# Patient Record
Sex: Female | Born: 1975
Health system: Southern US, Community
[De-identification: ages and names within clinical notes are randomized; demographics above are authoritative.]

## PROBLEM LIST (undated history)

## (undated) DIAGNOSIS — I10 Essential (primary) hypertension: Secondary | ICD-10-CM

## (undated) DIAGNOSIS — K469 Unspecified abdominal hernia without obstruction or gangrene: Secondary | ICD-10-CM

## (undated) HISTORY — DX: Unspecified abdominal hernia without obstruction or gangrene: K46.9

## (undated) HISTORY — DX: Essential (primary) hypertension: I10

## (undated) HISTORY — PX: HERNIA REPAIR: SHX51

---

## 2004-06-01 ENCOUNTER — Ambulatory Visit (HOSPITAL_COMMUNITY): Admission: AD | Admit: 2004-06-01 | Discharge: 2004-06-01 | Payer: Self-pay | Admitting: Obstetrics and Gynecology

## 2004-08-02 ENCOUNTER — Inpatient Hospital Stay (HOSPITAL_COMMUNITY): Admission: AD | Admit: 2004-08-02 | Discharge: 2004-08-05 | Payer: Self-pay | Admitting: Obstetrics and Gynecology

## 2006-02-08 ENCOUNTER — Emergency Department (HOSPITAL_COMMUNITY): Admission: EM | Admit: 2006-02-08 | Discharge: 2006-02-08 | Payer: Self-pay | Admitting: Emergency Medicine

## 2006-07-19 ENCOUNTER — Emergency Department (HOSPITAL_COMMUNITY): Admission: EM | Admit: 2006-07-19 | Discharge: 2006-07-19 | Payer: Self-pay | Admitting: Emergency Medicine

## 2007-08-08 ENCOUNTER — Emergency Department (HOSPITAL_COMMUNITY): Admission: EM | Admit: 2007-08-08 | Discharge: 2007-08-08 | Payer: Self-pay | Admitting: Emergency Medicine

## 2008-03-31 ENCOUNTER — Other Ambulatory Visit: Admission: RE | Admit: 2008-03-31 | Discharge: 2008-03-31 | Payer: Self-pay | Admitting: Obstetrics and Gynecology

## 2008-06-10 ENCOUNTER — Encounter (HOSPITAL_COMMUNITY): Admission: RE | Admit: 2008-06-10 | Discharge: 2008-07-10 | Payer: Self-pay | Admitting: Obstetrics and Gynecology

## 2008-09-03 ENCOUNTER — Inpatient Hospital Stay (HOSPITAL_COMMUNITY): Admission: AD | Admit: 2008-09-03 | Discharge: 2008-09-03 | Payer: Self-pay | Admitting: Obstetrics and Gynecology

## 2008-09-16 ENCOUNTER — Inpatient Hospital Stay (HOSPITAL_COMMUNITY): Admission: AD | Admit: 2008-09-16 | Discharge: 2008-09-19 | Payer: Self-pay | Admitting: Obstetrics and Gynecology

## 2008-09-17 ENCOUNTER — Encounter (INDEPENDENT_AMBULATORY_CARE_PROVIDER_SITE_OTHER): Payer: Self-pay | Admitting: Obstetrics and Gynecology

## 2010-01-03 ENCOUNTER — Emergency Department (HOSPITAL_COMMUNITY): Admission: EM | Admit: 2010-01-03 | Discharge: 2010-01-03 | Payer: Self-pay | Admitting: Emergency Medicine

## 2010-01-04 ENCOUNTER — Ambulatory Visit (HOSPITAL_COMMUNITY): Admission: RE | Admit: 2010-01-04 | Discharge: 2010-01-04 | Payer: Self-pay | Admitting: Emergency Medicine

## 2010-01-04 ENCOUNTER — Inpatient Hospital Stay (HOSPITAL_COMMUNITY): Admission: EM | Admit: 2010-01-04 | Discharge: 2010-01-10 | Payer: Self-pay | Admitting: Emergency Medicine

## 2010-01-04 ENCOUNTER — Ambulatory Visit: Payer: Self-pay | Admitting: Gastroenterology

## 2010-01-05 ENCOUNTER — Encounter: Payer: Self-pay | Admitting: Gastroenterology

## 2010-01-05 ENCOUNTER — Ambulatory Visit: Payer: Self-pay | Admitting: Internal Medicine

## 2010-01-06 ENCOUNTER — Encounter (INDEPENDENT_AMBULATORY_CARE_PROVIDER_SITE_OTHER): Payer: Self-pay | Admitting: General Surgery

## 2010-01-07 ENCOUNTER — Ambulatory Visit: Payer: Self-pay | Admitting: Internal Medicine

## 2010-01-09 ENCOUNTER — Ambulatory Visit: Payer: Self-pay | Admitting: Internal Medicine

## 2010-01-10 ENCOUNTER — Ambulatory Visit: Payer: Self-pay | Admitting: Internal Medicine

## 2010-01-11 ENCOUNTER — Encounter (INDEPENDENT_AMBULATORY_CARE_PROVIDER_SITE_OTHER): Payer: Self-pay

## 2010-01-11 ENCOUNTER — Encounter: Payer: Self-pay | Admitting: Internal Medicine

## 2010-01-11 DIAGNOSIS — R799 Abnormal finding of blood chemistry, unspecified: Secondary | ICD-10-CM | POA: Insufficient documentation

## 2010-01-12 ENCOUNTER — Encounter: Payer: Self-pay | Admitting: Internal Medicine

## 2010-01-25 ENCOUNTER — Encounter: Payer: Self-pay | Admitting: Internal Medicine

## 2010-01-31 LAB — CONVERTED CEMR LAB
ALT: 10 units/L (ref 0–35)
Albumin: 4.4 g/dL (ref 3.5–5.2)
Alkaline Phosphatase: 75 units/L (ref 39–117)
Bilirubin, Direct: 0.3 mg/dL (ref 0.0–0.3)

## 2010-11-02 ENCOUNTER — Ambulatory Visit: Payer: Self-pay | Admitting: Otolaryngology

## 2010-12-13 NOTE — Consult Note (Signed)
Summary: Consultation Report  Consultation Report   Imported By: Ricard Dillon 01/05/2010 09:01:08  _____________________________________________________________________  External Attachment:    Type:   Image     Comment:   External Document

## 2010-12-13 NOTE — Miscellaneous (Signed)
Summary: Orders Update  Clinical Lists Changes  Problems: Added new problem of BLOOD CHEMISTRY, ABNORMAL (ICD-790.99) Orders: Added new Test order of T-Hepatic Function ([id number]) - Signed 

## 2010-12-13 NOTE — Letter (Signed)
Summary: Recall, Labs Needed  [facility]  [address]   [place], [place] [address]   Phone: [phone]  Fax: [phone]    January 11, 2010  Ashley [name] [address] [place], [place]  [address] 02-Dec-1975   Dear [name],   Our records indicate it is time to repeat your blood work.  You can take the enclosed form to the lab on or near the date indicated.  Please make note of the new location of the lab:   [address], 2nd floor   [facility]  Our office will call you within a week to ten business days with the results.  If you do not hear from [place] in 10 business days, you should call the office.  If you have any questions regarding this, call the office at [phone], and ask for the nurse.  Labs are due on 01/25/2010.   Sincerely,    Hendricks Limes LPN  [facility] Ph: [phone]   Fax: [phone]

## 2011-01-31 LAB — PROTIME-INR
INR: 1.13 (ref 0.00–1.49)
Prothrombin Time: 14.4 seconds (ref 11.6–15.2)

## 2011-01-31 LAB — DIFFERENTIAL
Basophils Absolute: 0 10*3/uL (ref 0.0–0.1)
Basophils Absolute: 0 10*3/uL (ref 0.0–0.1)
Basophils Relative: 0 % (ref 0–1)
Basophils Relative: 0 % (ref 0–1)
Eosinophils Absolute: 0 10*3/uL (ref 0.0–0.7)
Eosinophils Absolute: 0 10*3/uL (ref 0.0–0.7)
Eosinophils Relative: 0 % (ref 0–5)
Lymphocytes Relative: 19 % (ref 12–46)
Lymphocytes Relative: 4 % — ABNORMAL LOW (ref 12–46)
Lymphs Abs: 1.3 10*3/uL (ref 0.7–4.0)
Monocytes Absolute: 0.5 10*3/uL (ref 0.1–1.0)
Monocytes Relative: 10 % (ref 3–12)
Monocytes Relative: 4 % (ref 3–12)
Neutro Abs: 10.3 10*3/uL — ABNORMAL HIGH (ref 1.7–7.7)
Neutro Abs: 3.3 10*3/uL (ref 1.7–7.7)
Neutro Abs: 3.8 10*3/uL (ref 1.7–7.7)
Neutro Abs: 3.8 10*3/uL (ref 1.7–7.7)
Neutrophils Relative %: 63 % (ref 43–77)
Neutrophils Relative %: 70 % (ref 43–77)
Neutrophils Relative %: 72 % (ref 43–77)

## 2011-01-31 LAB — COMPREHENSIVE METABOLIC PANEL
ALT: 113 U/L — ABNORMAL HIGH (ref 0–35)
ALT: 162 U/L — ABNORMAL HIGH (ref 0–35)
ALT: 40 U/L — ABNORMAL HIGH (ref 0–35)
AST: 109 U/L — ABNORMAL HIGH (ref 0–37)
AST: 116 U/L — ABNORMAL HIGH (ref 0–37)
AST: 155 U/L — ABNORMAL HIGH (ref 0–37)
AST: 234 U/L — ABNORMAL HIGH (ref 0–37)
Albumin: 4.2 g/dL (ref 3.5–5.2)
BUN: 7 mg/dL (ref 6–23)
Calcium: 8.7 mg/dL (ref 8.4–10.5)
Calcium: 9.3 mg/dL (ref 8.4–10.5)
Creatinine, Ser: 0.91 mg/dL (ref 0.4–1.2)
Creatinine, Ser: 0.96 mg/dL (ref 0.4–1.2)
Creatinine, Ser: 0.97 mg/dL (ref 0.4–1.2)
GFR calc Af Amer: >60 mL/min (ref >60)
GFR calc Af Amer: >60 mL/min (ref >60)
GFR calc Af Amer: >60 mL/min (ref >60)
GFR calc non Af Amer: >60 mL/min (ref >60)
GFR calc non Af Amer: >60 mL/min (ref >60)
Glucose, Bld: 104 mg/dL — ABNORMAL HIGH (ref 70–99)
Glucose, Bld: 89 mg/dL (ref 70–99)
Glucose, Bld: 97 mg/dL (ref 70–99)
Potassium: 4 mEq/L (ref 3.5–5.1)
Potassium: 4.1 mEq/L (ref 3.5–5.1)
Sodium: 133 mEq/L — ABNORMAL LOW (ref 135–145)
Sodium: 139 mEq/L (ref 135–145)
Total Protein: 7.1 g/dL (ref 6.0–8.3)
Total Protein: 7.2 g/dL (ref 6.0–8.3)
Total Protein: 7.7 g/dL (ref 6.0–8.3)

## 2011-01-31 LAB — LIPASE, BLOOD
Lipase: 19 U/L (ref 11–59)
Lipase: 32 U/L (ref 11–59)
Lipase: 50 U/L (ref 11–59)

## 2011-01-31 LAB — HEPATIC FUNCTION PANEL
ALT: 121 U/L — ABNORMAL HIGH (ref 0–35)
AST: 111 U/L — ABNORMAL HIGH (ref 0–37)
AST: 127 U/L — ABNORMAL HIGH (ref 0–37)
AST: 53 U/L — ABNORMAL HIGH (ref 0–37)
Albumin: 3.1 g/dL — ABNORMAL LOW (ref 3.5–5.2)
Albumin: 3.3 g/dL — ABNORMAL LOW (ref 3.5–5.2)
Alkaline Phosphatase: 114 U/L (ref 39–117)
Bilirubin, Direct: 0.4 mg/dL — ABNORMAL HIGH (ref 0.0–0.3)
Bilirubin, Direct: 2.7 mg/dL — ABNORMAL HIGH (ref 0.0–0.3)
Indirect Bilirubin: 1 mg/dL — ABNORMAL HIGH (ref 0.3–0.9)
Total Bilirubin: 1 mg/dL (ref 0.3–1.2)
Total Bilirubin: 1.4 mg/dL — ABNORMAL HIGH (ref 0.3–1.2)
Total Bilirubin: 1.5 mg/dL — ABNORMAL HIGH (ref 0.3–1.2)
Total Bilirubin: 4.8 mg/dL — ABNORMAL HIGH (ref 0.3–1.2)
Total Protein: 6.5 g/dL (ref 6.0–8.3)

## 2011-01-31 LAB — CBC
HCT: 32 % — ABNORMAL LOW (ref 36.0–46.0)
HCT: 33.2 % — ABNORMAL LOW (ref 36.0–46.0)
HCT: 33.8 % — ABNORMAL LOW (ref 36.0–46.0)
Hemoglobin: 10.9 g/dL — ABNORMAL LOW (ref 12.0–15.0)
Hemoglobin: 11.2 g/dL — ABNORMAL LOW (ref 12.0–15.0)
Hemoglobin: 11.5 g/dL — ABNORMAL LOW (ref 12.0–15.0)
Hemoglobin: 13.1 g/dL (ref 12.0–15.0)
MCHC: 33.7 g/dL (ref 30.0–36.0)
MCHC: 33.9 g/dL (ref 30.0–36.0)
MCHC: 34.5 g/dL (ref 30.0–36.0)
MCHC: 34.6 g/dL (ref 30.0–36.0)
MCV: 89.3 fL (ref 78.0–100.0)
MCV: 89.4 fL (ref 78.0–100.0)
Platelets: 227 10*3/uL (ref 150–400)
Platelets: 230 10*3/uL (ref 150–400)
RBC: 3.58 MIL/uL — ABNORMAL LOW (ref 3.87–5.11)
RBC: 3.68 MIL/uL — ABNORMAL LOW (ref 3.87–5.11)
RBC: 3.79 MIL/uL — ABNORMAL LOW (ref 3.87–5.11)
RBC: 4.24 MIL/uL (ref 3.87–5.11)
RDW: 12.9 % (ref 11.5–15.5)
RDW: 13.3 % (ref 11.5–15.5)
RDW: 13.3 % (ref 11.5–15.5)
WBC: 5.1 10*3/uL (ref 4.0–10.5)
WBC: 5.3 10*3/uL (ref 4.0–10.5)

## 2011-01-31 LAB — URINALYSIS, ROUTINE W REFLEX MICROSCOPIC
Bilirubin Urine: NEGATIVE
Ketones, ur: NEGATIVE mg/dL
Leukocytes, UA: NEGATIVE
Nitrite: NEGATIVE
Specific Gravity, Urine: 1.02 (ref 1.005–1.030)
pH: 6 (ref 5.0–8.0)

## 2011-01-31 LAB — BASIC METABOLIC PANEL
CO2: 24 mEq/L (ref 19–32)
Calcium: 9 mg/dL (ref 8.4–10.5)
GFR calc Af Amer: >60 mL/min (ref >60)
GFR calc non Af Amer: >60 mL/min (ref >60)
Sodium: 124 mEq/L — ABNORMAL LOW (ref 135–145)

## 2011-01-31 LAB — PREGNANCY, URINE: Preg Test, Ur: NEGATIVE

## 2011-02-04 LAB — HEPATIC FUNCTION PANEL
ALT: 83 U/L — ABNORMAL HIGH (ref 0–35)
AST: 84 U/L — ABNORMAL HIGH (ref 0–37)
Albumin: 3 g/dL — ABNORMAL LOW (ref 3.5–5.2)
Alkaline Phosphatase: 140 U/L — ABNORMAL HIGH (ref 39–117)
Indirect Bilirubin: 0.8 mg/dL (ref 0.3–0.9)
Total Protein: 6.3 g/dL (ref 6.0–8.3)

## 2011-03-27 NOTE — Discharge Summary (Signed)
NAMEKRISTIAN, HAZZARD            ACCOUNT NO.:  [id number]   MEDICAL RECORD NO.:  [id number]          PATIENT TYPE:  INP   LOCATION:  9123                          [facility]ACILITY:  [facility]   PHYSICIAN:  Hal Morales, M.D.DATE OF BIRTH:  25-Mar-1976   DATE OF ADMISSION:  09/16/2008  DATE OF DISCHARGE:                               DISCHARGE SUMMARY   ADMITTING DIAGNOSES:  1. Intrauterine pregnancy at 39-3/7 weeks.  2. Spontaneous rupture of membranes.  3. Desires tubal sterilization.  4. Negative group B streptococcus.   DISCHARGE DIAGNOSES:  1. Intrauterine pregnancy at 39-3/7 weeks.  2. Spontaneous rupture of membranes.  3. Desires tubal sterilization.  4. Negative group B streptococcus.   PROCEDURES:  1. Normal spontaneous vaginal birth.  2. Repair of second-degree midline laceration.  3. Postpartum tubal sterilization.   HOSPITAL COURSE:  Ashley [name] is a 35 year old gravida 3, para 2-2-0-2  at 39-3/7 weeks, who presented on the evening of September 16, 2008, with  rupture of membranes at approximately 6:45 p.m.  She desired tubal.  Her  pregnancy had been remarkable for:  1. Intrauterine pregnancy at 39-3/7 weeks.  2. First trimester bleeding.  3. Second trimester anemia.  4. Mild periumbilical hernia.   The patient, when she was admitted, she was 4-5, 90%, vertex, -2,  positive Nitrazine was noted, and fluid was clear.  The heart rate was  reactive.  Uterine contractions were every 3.5-7 minutes.  The patient  did report her desire for tubal.  The patient progressed well and  delivered at 1:45 a.m. on September 17, 2008.  She had a girl, weight 6  pounds 10 ounces, Apgars were 8 and 9.  There was a second-degree  laceration noted.  This was repaired in the usual fashion.  The patient  did wish to proceed with tubal sterilization.  This was scheduled at the  first available time at 3 p.m. on the afternoon of September 17, 2008.  At  that time, the patient was taken to the  operating room where Dr. Dierdre Forth performed a bilateral tubal ligation on the patient under spinal  anesthesia.  The patient tolerated the procedure well, and was taken to  recovery in good condition.  Infant remained in the full-term nursery.  On postop day #1, the patient was doing well.  She was up ad lib,  tolerating a regular diet, voiding without difficulty.  She was bottle  feeding.  Vital signs were stable.  Incision was clean, dry, and intact.  Hemoglobin was 9.5, white blood cell count 9.6, and platelet count was  183.  The rest of the patient's hospital stay was uncomplicated.  By  postop day #2, postpartum day #2, she was doing well.  She was up ad  lib.  Incision was clean, dry, and intact.  She was deemed to receive  full benefit of her hospital stay and was discharged to home.  Discharge  instructions per [facility] handout.   DISCHARGE MEDICATIONS:  1. Motrin 600 mg p.o. q.6 h p.r.n. pain.  2. Percocet 5/325 one to two p.o. q.3-4 hours p.r.n. pain.  Discharge followup will occur in 6 weeks at [facility].      Renaldo Reel Emilee Hero, C.N.M.      ______________________________  Hal Morales, M.D.    VLL/MEDQ  D:  09/19/2008  T:  09/19/2008  Job:  [id number]

## 2011-03-27 NOTE — H&P (Signed)
Ashley [name], Ashley [name]            ACCOUNT NO.:  [id number]   MEDICAL RECORD NO.:  [id number]          PATIENT TYPE:  INP   LOCATION:  9164                          [facility]   PHYSICIAN:  Janine Limbo, M.D.DATE OF BIRTH:  July 18, 1976   DATE OF ADMISSION:  09/16/2008  DATE OF DISCHARGE:                              HISTORY & PHYSICAL   Ashley [name] is a 35 year old, gravida 3, para 2, at 63 and 3 weeks, who  called reporting spontaneous rupture of membranes at 1845 on September 16, 2008, with clear fluid running down her leg.  I told her to come in for  evaluation.  She states that she was 4 cm in the office last visit and  the baby is moving.  She also says that she is taking prenatal vitamin  and Integra iron 1 daily and denies any illness in herself or her family  members.  She said the contractions are tolerable and not painful.  She  is a patient of Dr. Debria Garret.  Ashley [name]' current pregnancy is  remarkable for some first trimester bleeding and some second trimester  anemia.  The first trimester bleeding occurred around 8 weeks and was  evaluated by a doctor in [place], [place].  Ashley [name] has  also had a problem with some pain around her umbilicus and a small  hernia during this pregnancy.  She has had some minor headaches that  were relieved by rest.  In the second trimester, she developed some  stronger headaches and took some Tylenol 3 for that.  She also take some  Flexeril for some rib pain.   PRENATAL LABS:  Ashley [name] is A positive in her blood type, antibody  screen negative, sickle cell trait negative.  When she entered care, her  hemoglobin was 11.9.  Her Pap was negative and normal in May 2009.  Gonorrhea and Chlamydia are negative.  RPR nonreactive.  Rubella titer  immune.  Hepatitis B surface antigen negative.  HIV nonreactive.  Third  trimester group B strep negative.   HISTORY OF PRESENT PREGNANCY:  Ashley [name] current medications  are as  stated, prenatal vitamin and Integra F iron pill 1 daily each.  She did  complete her new OB interview on May 12, 2008, and had her new OB exam  on May 27, 2008, and was 22 weeks and 6 days at that time.  An  ultrasound was scheduled.  She has had some early prenatal care done at  [facility].  At 23 weeks, she began having back pain and Tylenol 3 was  ordered, an ultrasound was done, size were equal to date with an  estimated date of confinement, September 24, 2008.  The cervix on the  ultrasound was 3.08 cm.  The fluid was normal.  The placenta was  posteriorly located and the anatomy was normal.  At 28 weeks, she had a  Glucola, which was normal at 102, hemoglobin was 8.8.  She continued to  have some CVA tenderness in her ribs and was prescribed some Flexeril.  She signed some tubal ligation papers at that  time.  She was given some  iron samples and some dietary guidance.  Around 30 weeks, antibody  screen was checked with sickle cell trait check, both were negative.  There was some effort made to look into a general surgeon to assess her  minor umbilical hernia for possible repair.  At 33 weeks, she was  continuing to have some back pain.  Ultrasound was done for poor weight  gain and concerns about growth and the size was consistent with dates.  The cervix was 4.6 cm and estimated fetal weight was 4 pounds 14 ounces,  which was in the 63rd percentile.  An NST was also done, which was  reactive, which showed no contractions of the uterus.  The patient was  put on pelvic rest at that time, since she was having some minor  spotting and flow.  The remainder of the pregnancy was unremarkable with  a negative group B strep test on August 25, 2008, and the patient has  been scheduled for an induction next week, when she presented with her  spontaneous rupture of membranes today.   OBSTETRICAL HISTORY:  In August 2000, she had her first pregnancy, which  ended in a 40-week  delivery via spontaneous vaginal delivery of a  daughter named Ashley [name].  In September 2005, she had another 40-week  pregnancy that ended with a spontaneous vaginal delivery of another  daughter named Ashley [name], and her third pregnancy is the current pregnancy.   ALLERGIES:  Ashley [name] has no drug allergies and no known food  allergies.   MEDICAL HISTORY:  Ashley [name] medical history shows that she was anemic  with her first pregnancy.  For birth control, she has used Depo-Provera  in the past.  She has no chronic health problems.   SURGICAL HISTORY:  Ashley [name] has never had surgery.   FAMILY HISTORY:  Her maternal grandmother and her sister on medication  for hypertension.  Her maternal grandmother and sister on medication for  diabetes.  She also has a nephew with webbed toes.   GENETIC HISTORY:  Noncontributory, except for that one nephew with  webbed toes, negative sickle cell screen.   SOCIAL HISTORY:  Ashley [name] is married to [employer].  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  Her husband  also has a high school diploma and works as a Technical sales engineer.  Ashley [name] is  an African-American and she declines to state any religious preference.  She denies any use of alcohol, tobacco, or street drugs during the  pregnancy.   PHYSICAL EXAMINATION:  VITAL SIGNS:  Stable.  Afebrile.  HEENT:  Within normal limits.  No thyromegaly noted.  LUNGS:  Clear to auscultation bilaterally.  HEART:  Regular rate and rhythm.  No murmur.  BREASTS:  Soft and nontender.  Nipples everted.  ABDOMEN:  Gravid, soft uterine resting tone between contractions.  EXTREMITIES:  Without edema.  DTRs +2.  Fetal heart rate 130, moderate  variability, reactive, multiple accelerations greater than 170 beats  minute, very active fetus with positive scalp stimulation response.  Contractions every 3-1/2 to 7 minutes, lasting 50 to 90  seconds.  Moderate strength.  PELVIC:  Cervical  exam 4-5 cm dilated, 90% effaced, -2 station, soft and  stretchy cervix.  Positive Nitrazine test, grossly ruptured with clear  fluid from vagina.   IMPRESSION:  Intrauterine pregnancy at 20 and 3 weeks, spontaneous  rupture of membrane with clear fluid, early labor,  reassuring fetal hart  rate.   PLAN:  Admit with routine physician orders for Dr. Stefano Gaul.  Anticipate  normal spontaneous vaginal delivery, bilateral tubal ligation per  patient preference.  Consent is in the chart.      Eulogio Bear, CNM      ______________________________  Janine Limbo, M.D.    JM/MEDQ  D:  09/17/2008  T:  09/17/2008  Job:  [id number]

## 2011-03-27 NOTE — Op Note (Signed)
[name], [name]            ACCOUNT NO.:  [id number]   MEDICAL RECORD NO.:  [id number]          PATIENT TYPE:  INP   LOCATION:                                [facility]   PHYSICIAN:  Hal Morales, M.D.DATE OF BIRTH:  [birth year]   DATE OF PROCEDURE:  DATE OF DISCHARGE:  09/19/2008                               OPERATIVE REPORT   PREOPERATIVE DIAGNOSIS:  Desire for surgical sterilization.   POSTOPERATIVE DIAGNOSIS:  Desire for surgical sterilization.   OPERATION:  Postpartum tubal sterilization.   SURGEON:  Hal Morales, MD   ANESTHESIA:  Spinal.   ESTIMATED BLOOD LOSS:  Less than 10 mL.   COMPLICATIONS:  None.   FINDINGS:  The patient was postpartum.  The tubes were normal for  postpartum state.   PROCEDURE:  The patient was taken to the operating room after  appropriate identification and placed on the operating table.  After the  placement of a spinal anesthetic, she was placed in the supine position  with a left lateral tilt.  The abdomen was prepped with multiple layers  of Betadine and draped in sterile field.  After assurance of adequate  anesthesia.  Subumbilical injection of 10 mL of 0.25% Marcaine was  undertaken.  A subumbilical incision was made and the abdomen opened in  layers to enter the peritoneum.  The left fallopian tube was identified,  followed to its fimbriated end, then grasped at the isthmic portion and  elevated.  A suture of 2-0 chromic was placed through the mesosalpinx  and tied fore and aft on the knuckle of tube.  A second ligature was  placed proximal to that and the intervening knuckle of tube excised.  The cut ends were cauterized.  Hemostasis was noted to be adequate.  A  similar procedure was carried out on the opposite side.  The abdominal  peritoneum and fascia were closed in a running suture of 0 Vicryl.  The  skin incision was then closed with a subcuticular suture of 3-0 Vicryl.  A sterile dressing was applied.  The  patient was taken from the  operating room to the recovery room in satisfactory condition having  tolerated the procedure well with sponge and instrument counts correct.   SPECIMENS TO PATHOLOGY:  Portions of right and left tube.      Hal Morales, M.D.  Electronically Signed     VPH/MEDQ  D:  09/17/2008  T:  09/18/2008  Job:  [id number]

## 2011-06-28 IMAGING — RF DG ERCP WO/W SPHINCTEROTOMY
1 series · 15 of 18 positions shown · non-contrast
Comparison: None.

CLINICAL DATA: Cholecystitis

ERCP WITH SPHINCTEROTOMY:
TECHNIQUE: Multiple spot images obtained with the fluoroscopic
device and submitted for interpretation post-procedure.  ERCP was
performed by Dr. Dr. Moolman.

[Series 1: run · 6 acquisitions, 15 frames shown]
[im 1/6]
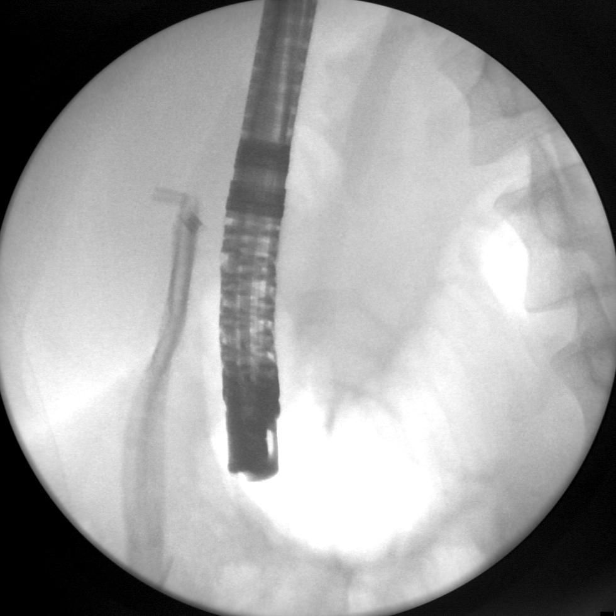
[im 2/6]
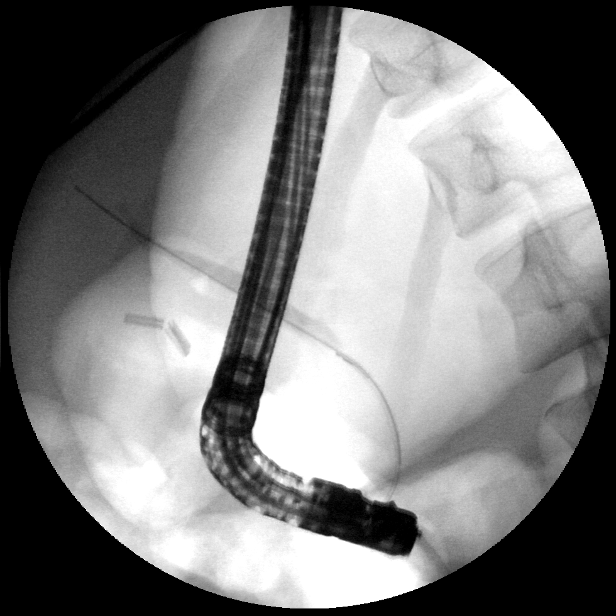
[im 2/6]
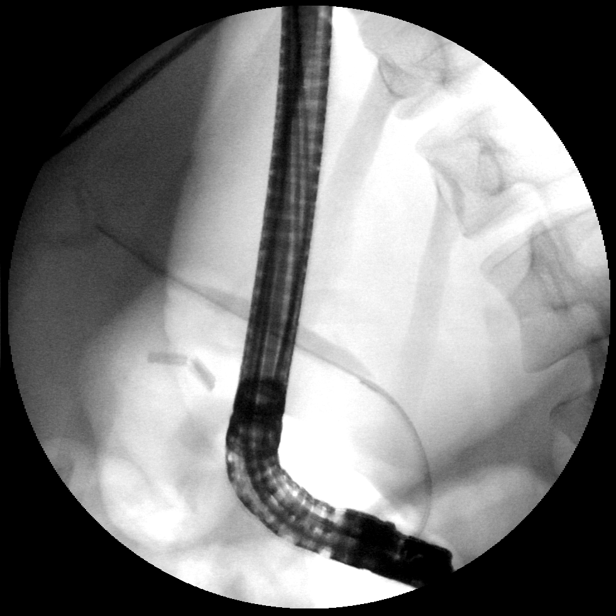
[im 2/6]
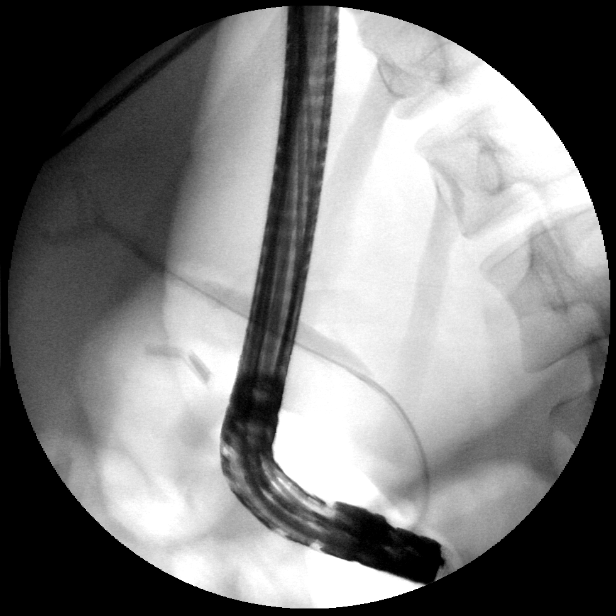
[im 3/6]
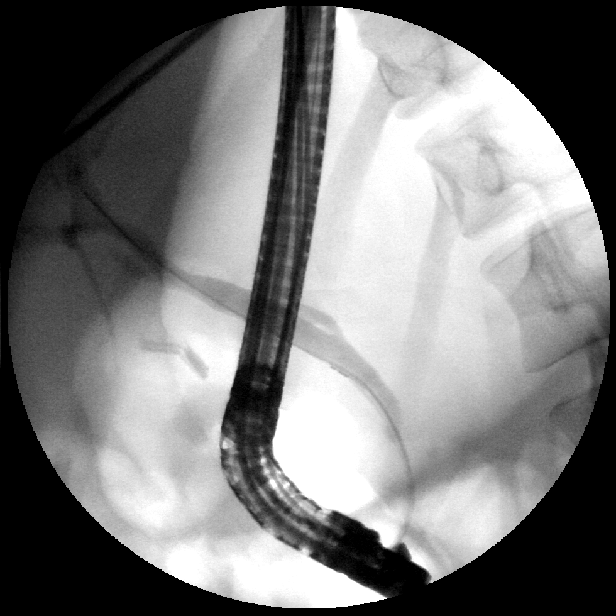
[im 3/6]
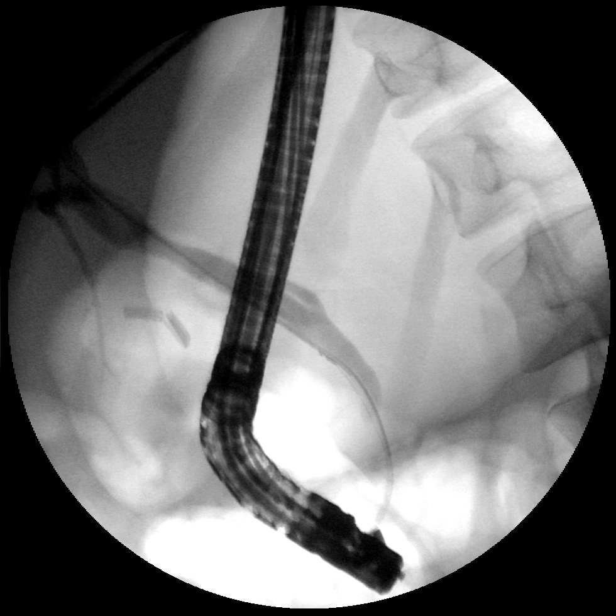
[im 3/6]
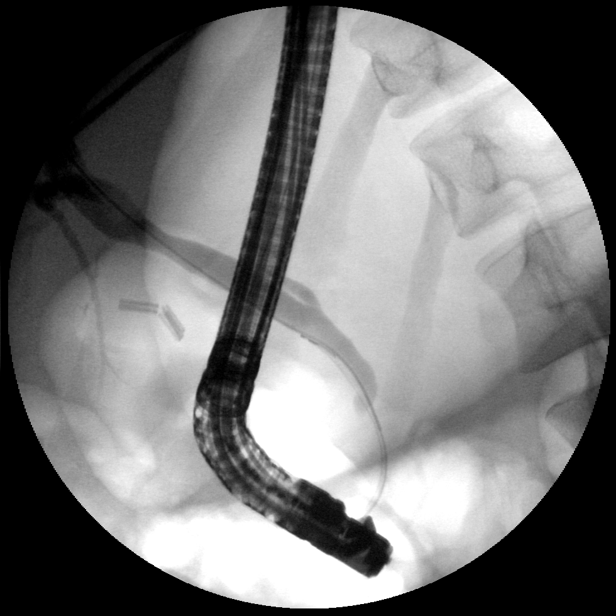
[im 4/6]
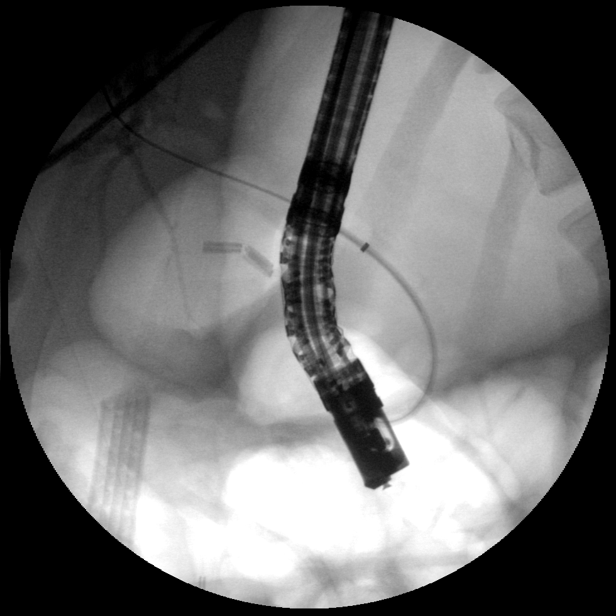
[im 4/6]
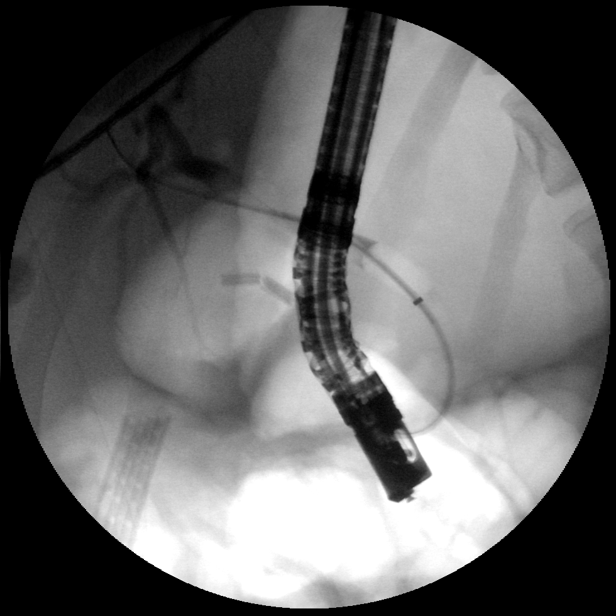
[im 4/6]
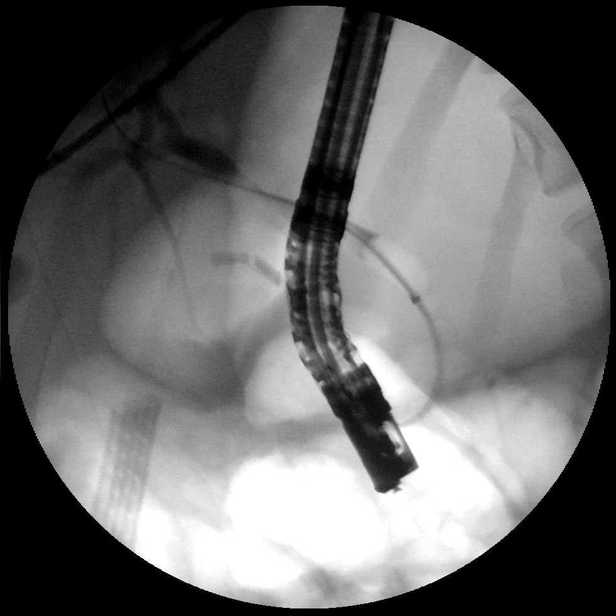
[im 4/6]
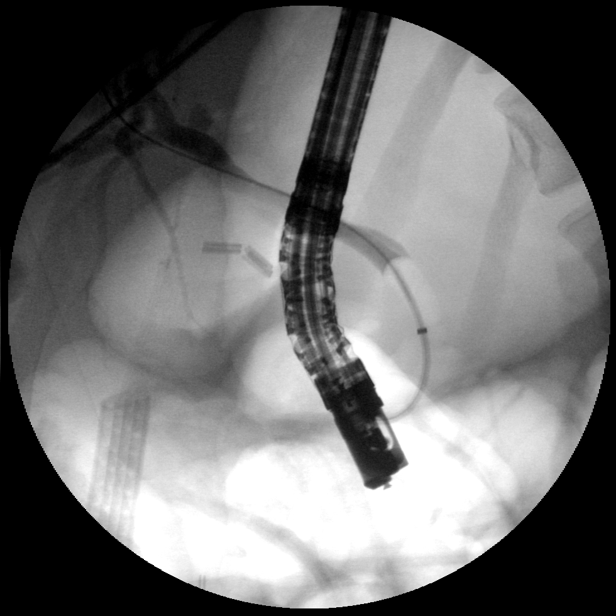
[im 5/6]
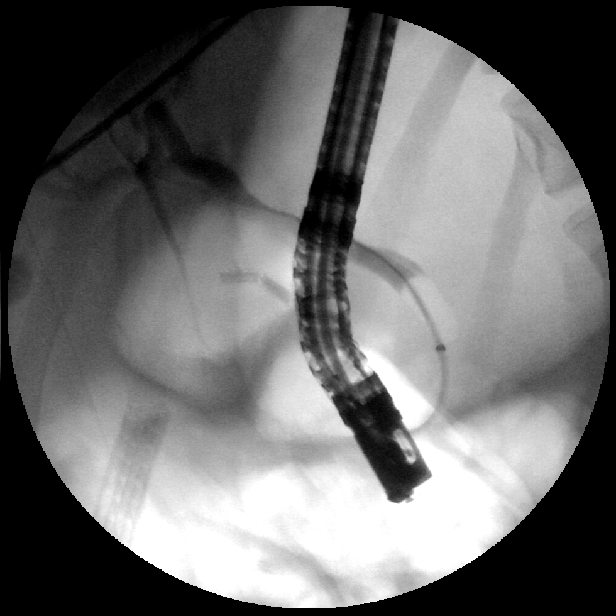
[im 5/6]
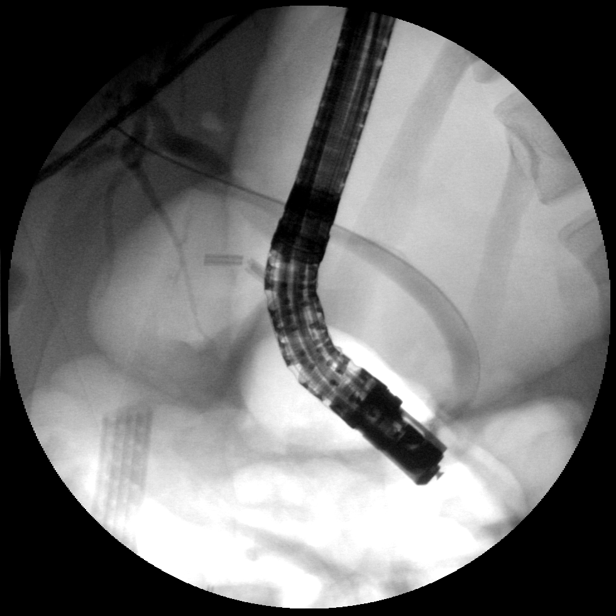
[im 5/6]
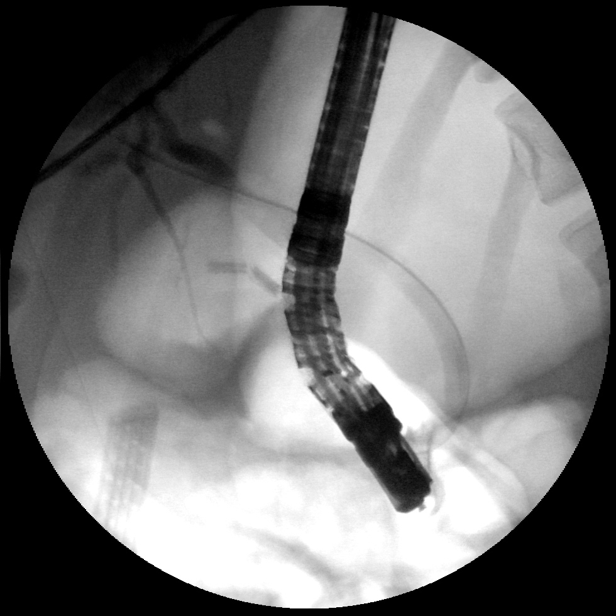
[im 6/6]
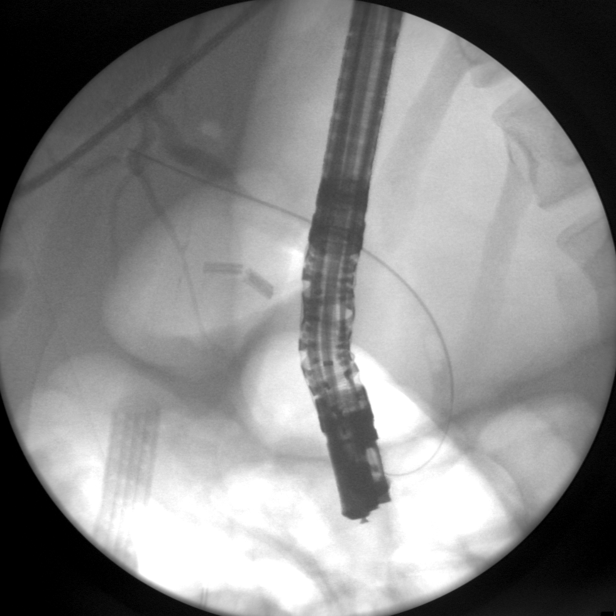

[15 of 18 positions shown; findings below may reference images not displayed]

FINDINGS: Surgical drain and clips from preceding cholecystectomy.
With injection of contrast into CBD, opacification of extrahepatic
biliary tree achieved.
CBD appears normal in caliber with smooth walls.
No stricture, mass, or areas of wall irregularity identified.
No persistent intraluminal filling defects.
A balloon was passed, with prompt clearance of contrast into the
duodenum following balloon passage, indicating patent
sphincterotomy.
No contrast extravasation.
IMPRESSION: Normal ERCP images.

These images were submitted for radiologic interpretation only.
Please see the procedural report for the amount of contrast and the
fluoroscopy time utilized.

## 2011-08-14 LAB — CBC
HCT: 28.6 — ABNORMAL LOW
HCT: 35.5 — ABNORMAL LOW
Hemoglobin: 11.6 — ABNORMAL LOW
Hemoglobin: 9.5 — ABNORMAL LOW
MCHC: 33.3
MCV: 91.3
RBC: 3.13 — ABNORMAL LOW
RBC: 3.89
WBC: 8.2
WBC: 9.6

## 2011-08-14 LAB — WET PREP, GENITAL: Clue Cells Wet Prep HPF POC: NONE SEEN

## 2015-10-24 ENCOUNTER — Ambulatory Visit: Payer: Self-pay | Admitting: Family Medicine

## 2019-09-02 ENCOUNTER — Other Ambulatory Visit: Payer: Self-pay | Admitting: *Deleted

## 2019-09-02 DIAGNOSIS — Z20822 Contact with and (suspected) exposure to covid-19: Secondary | ICD-10-CM

## 2019-09-04 LAB — NOVEL CORONAVIRUS, NAA: SARS-CoV-2, NAA: DETECTED — AB

## 2019-10-14 ENCOUNTER — Other Ambulatory Visit: Payer: Self-pay

## 2019-10-14 DIAGNOSIS — Z20822 Contact with and (suspected) exposure to covid-19: Secondary | ICD-10-CM

## 2019-10-16 LAB — NOVEL CORONAVIRUS, NAA: SARS-CoV-2, NAA: NOT DETECTED

## 2020-01-12 ENCOUNTER — Ambulatory Visit: Payer: Self-pay | Attending: Internal Medicine

## 2020-01-12 DIAGNOSIS — Z23 Encounter for immunization: Secondary | ICD-10-CM | POA: Insufficient documentation

## 2020-01-12 NOTE — Progress Notes (Signed)
   Covid-19 Vaccination Clinic  Name:  Ashley [name]    MRN: [id number] DOB: 1976/04/01  01/12/2020  [name] was observed post Covid-19 immunization for 15 minutes without incident. She was provided with Vaccine Information Sheet and instruction to access the V-Safe system.   [name] was instructed to call 911 with any severe reactions post vaccine: Marland Kitchen Difficulty breathing  . Swelling of face and throat  . A fast heartbeat  . A bad rash all over body  . Dizziness and weakness   Immunizations Administered    Name Date Dose VIS Date Route   Moderna COVID-19 Vaccine 01/12/2020  3:54 PM 0.5 mL 10/13/2019 Intramuscular   Manufacturer: Moderna   Lot: [id number]   [id number]

## 2020-02-09 ENCOUNTER — Ambulatory Visit: Payer: Self-pay | Attending: Internal Medicine

## 2020-02-09 DIAGNOSIS — Z23 Encounter for immunization: Secondary | ICD-10-CM

## 2020-02-09 NOTE — Progress Notes (Signed)
   Covid-19 Vaccination Clinic  Name:  Ashley [name]    MRN: [id number] DOB: Dec 07, 1975  02/09/2020  [name] was observed post Covid-19 immunization for 15 minutes without incident. She was provided with Vaccine Information Sheet and instruction to access the V-Safe system.   [name] was instructed to call 911 with any severe reactions post vaccine: Marland Kitchen Difficulty breathing  . Swelling of face and throat  . A fast heartbeat  . A bad rash all over body  . Dizziness and weakness   Immunizations Administered    Name Date Dose VIS Date Route   Moderna COVID-19 Vaccine 02/09/2020  2:30 PM 0.5 mL 10/13/2019 Intramuscular   Manufacturer: Moderna   Lot: [id number]   [place]:[id number]

## 2020-08-10 ENCOUNTER — Other Ambulatory Visit: Payer: Self-pay

## 2020-08-10 DIAGNOSIS — Z20822 Contact with and (suspected) exposure to covid-19: Secondary | ICD-10-CM

## 2020-08-11 LAB — SARS-COV-2, NAA 2 DAY TAT

## 2020-08-11 LAB — NOVEL CORONAVIRUS, NAA: SARS-CoV-2, NAA: NOT DETECTED

## 2020-12-28 DIAGNOSIS — B029 Zoster without complications: Secondary | ICD-10-CM | POA: Diagnosis not present

## 2020-12-28 DIAGNOSIS — S29019A Strain of muscle and tendon of unspecified wall of thorax, initial encounter: Secondary | ICD-10-CM | POA: Diagnosis not present

## 2020-12-28 DIAGNOSIS — Z6831 Body mass index (BMI) 31.0-31.9, adult: Secondary | ICD-10-CM | POA: Diagnosis not present

## 2021-03-09 DIAGNOSIS — S6992XA Unspecified injury of left wrist, hand and finger(s), initial encounter: Secondary | ICD-10-CM | POA: Diagnosis not present

## 2021-03-28 DIAGNOSIS — M79642 Pain in left hand: Secondary | ICD-10-CM | POA: Diagnosis not present

## 2021-04-04 DIAGNOSIS — R200 Anesthesia of skin: Secondary | ICD-10-CM | POA: Diagnosis not present

## 2021-04-04 DIAGNOSIS — S6992XA Unspecified injury of left wrist, hand and finger(s), initial encounter: Secondary | ICD-10-CM | POA: Diagnosis not present

## 2021-04-04 DIAGNOSIS — S60222D Contusion of left hand, subsequent encounter: Secondary | ICD-10-CM | POA: Diagnosis not present

## 2021-04-04 DIAGNOSIS — X58XXXD Exposure to other specified factors, subsequent encounter: Secondary | ICD-10-CM | POA: Diagnosis not present

## 2021-04-04 DIAGNOSIS — M7989 Other specified soft tissue disorders: Secondary | ICD-10-CM | POA: Diagnosis not present

## 2021-04-13 ENCOUNTER — Telehealth (INDEPENDENT_AMBULATORY_CARE_PROVIDER_SITE_OTHER): Payer: Self-pay | Admitting: Nurse Practitioner

## 2021-04-13 ENCOUNTER — Other Ambulatory Visit: Payer: Self-pay

## 2021-04-13 ENCOUNTER — Ambulatory Visit (INDEPENDENT_AMBULATORY_CARE_PROVIDER_SITE_OTHER): Payer: BC Managed Care – PPO | Admitting: Nurse Practitioner

## 2021-04-13 ENCOUNTER — Encounter (INDEPENDENT_AMBULATORY_CARE_PROVIDER_SITE_OTHER): Payer: Self-pay | Admitting: Nurse Practitioner

## 2021-04-13 VITALS — BP 168/84 | HR 107 | Temp 96.4°F | Ht 66.0 in | Wt 192.6 lb

## 2021-04-13 DIAGNOSIS — I10 Essential (primary) hypertension: Secondary | ICD-10-CM | POA: Diagnosis not present

## 2021-04-13 DIAGNOSIS — Z124 Encounter for screening for malignant neoplasm of cervix: Secondary | ICD-10-CM

## 2021-04-13 DIAGNOSIS — Z1231 Encounter for screening mammogram for malignant neoplasm of breast: Secondary | ICD-10-CM

## 2021-04-13 DIAGNOSIS — Z131 Encounter for screening for diabetes mellitus: Secondary | ICD-10-CM | POA: Diagnosis not present

## 2021-04-13 DIAGNOSIS — Z6831 Body mass index (BMI) 31.0-31.9, adult: Secondary | ICD-10-CM

## 2021-04-13 DIAGNOSIS — Z1329 Encounter for screening for other suspected endocrine disorder: Secondary | ICD-10-CM

## 2021-04-13 DIAGNOSIS — K469 Unspecified abdominal hernia without obstruction or gangrene: Secondary | ICD-10-CM

## 2021-04-13 DIAGNOSIS — E669 Obesity, unspecified: Secondary | ICD-10-CM

## 2021-04-13 MED ORDER — LOSARTAN POTASSIUM-HCTZ 50-12.5 MG PO TABS: 1.0000 | ORAL_TABLET | Freq: Every day | ORAL | 0 refills | Status: DC

## 2021-04-13 NOTE — Telephone Encounter (Signed)
Please work on her referral to general surgery as soon as possible, I don't think this is technically an urgent surgery, but I would like for her to get in to see the surgeon sooner rather than later.

## 2021-04-13 NOTE — Telephone Encounter (Signed)
Ok sending to [facility] @ Dr Arnoldo Morale office.

## 2021-04-13 NOTE — Telephone Encounter (Signed)
APPT IS SCHEDULE; PT WILL SEE IN HER MYCHART ACCOUNT.

## 2021-04-13 NOTE — Telephone Encounter (Signed)
I also ordered screening mammogram today. Thank you.

## 2021-04-13 NOTE — Progress Notes (Signed)
Subjective:  Patient ID: Ashley [name], female    DOB: 04-21-1976  Age: 45 y.o. MRN: [id number]  CC:  Chief Complaint  Patient presents with  . Establish Care  . Other    Hernia, health maintenance  . Hypertension      HPI  This patient arrives today for the above.  She is here to establish care as a new patient.  She was referred here by a different patient of ours.  She used to go to a different family medicine clinic in [place] but felt that she would prefer to see a different primary care provider.  Hernia: She has a history of hernia and has had a hernia repair approximately 10 years ago by Dr. Romona Curls, she is not sure the exact year that her hernia pair was done.  The hernia is located in her abdomen by her left right lower quadrant.  She knows that her hernia occurred shortly after she gave birth to her child who was born 2009.  Over the last year or so she has noticed a hernia and growing and it has become a bit more sore and tender.  She tells me it is soft and reducible.  She continues to have regular bowel movements and is not having any nausea or vomiting.  Hypertension: She does have a history of hypertension.  She used to be taking losartan-hydrochlorothiazide (40m-12.5mg), but tells me she ran out of the medication so she is not on it currently.  She tells me she does monitor her blood pressure at home and it is elevated at home.  She denies any history of kidney disease or electrolyte abnormalities.  Health maintenance: She has not yet had a mammogram and she tells me she has not had a Pap smear in nearly 13 years.  Past Medical History:  Diagnosis Date  . Hernia of abdominal cavity   . Hypertension       Family History  Problem Relation Age of Onset  . Cancer Mother   . CVA Father   . Crohn's disease Father   . Hypertension Maternal Grandmother   . Diabetes Maternal Grandmother   . Dementia Maternal Grandfather   . Pneumonia Paternal Grandfather      Social History   Social History Narrative  . Not on file   Social History   Tobacco Use  . Smoking status: Never Smoker  . Smokeless tobacco: Never Used  Substance Use Topics  . Alcohol use: Not Currently     Current Meds  Medication Sig  . losartan-hydrochlorothiazide (HYZAAR) 50-12.5 MG tablet Take 1 tablet by mouth daily.    ROS:  Review of Systems  Eyes: Positive for blurred vision (believes she needs new prescription for her glasses).  Respiratory: Positive for shortness of breath (maybe related to covid).   Cardiovascular: Negative for chest pain.  Gastrointestinal: Positive for abdominal pain. Negative for blood in stool, constipation, melena, nausea and vomiting.       (+) hernia  Neurological: Positive for headaches. Negative for dizziness.     Objective:   Today's Vitals: BP (!) 168/84   Pulse (!) 107   Temp (!) 96.4 F (35.8 C)   Ht 5' 6" (1.676 m)   Wt 192 lb 9.6 oz (87.4 kg)   LMP 04/07/2021   SpO2 96%   BMI 31.09 kg/m  Vitals with BMI 04/13/2021  Height 5' 6"  Weight 192 lbs 10 oz  BMI 367.6 Systolic 1195 Diastolic  84  Pulse 107     Physical Exam Vitals reviewed.  Constitutional:      General: She is not in acute distress.    Appearance: Normal appearance.  HENT:     Head: Normocephalic and atraumatic.  Neck:     Vascular: No carotid bruit.  Cardiovascular:     Rate and Rhythm: Normal rate and regular rhythm.     Pulses: Normal pulses.     Heart sounds: Normal heart sounds.  Pulmonary:     Effort: Pulmonary effort is normal.     Breath sounds: Normal breath sounds.  Abdominal:     General: Bowel sounds are decreased.    Skin:    General: Skin is warm and dry.  Neurological:     General: No focal deficit present.     Mental Status: She is alert and oriented to person, place, and time.  Psychiatric:        Mood and Affect: Mood normal.        Behavior: Behavior normal.        Judgment: Judgment normal.           Assessment and Plan   1. Hypertension, unspecified type   2. Abdominal hernia without obstruction and without gangrene, recurrence not specified, unspecified hernia type   3. Class 1 obesity with serious comorbidity and body mass index (BMI) of 31.0 to 31.9 in adult, unspecified obesity type   4. Screening for diabetes mellitus   5. Screening for thyroid disorder   6. Encounter for screening mammogram for malignant neoplasm of breast   7. Cervical cancer screening      Plan: 1.  I will check CMP and refill her losartan hydrochlorothiazide.  I did verify that her telephone in the system is the correct phone number to contact her in the event that we need to make changes based on metabolic panel results.  She will follow-up in 2 to 3 weeks to monitor blood pressure and probably recheck metabolic panel. 2.  We will refer to general surgery to discuss surgical options for hernia repair. 3.-7.  We will order blood work for further evaluation and I will order screening mammogram as well as referred to OB/GYN for cervical cancer screening.   Tests ordered Orders Placed This Encounter  Procedures  . MM Digital Screening  . CMP with eGFR([facility])  . CBC with Differential/Platelets  . Hemoglobin A1c  . TSH  . Lipid Panel  . Ambulatory referral to General Surgery  . Ambulatory referral to Obstetrics / Gynecology      Meds ordered this encounter  Medications  . losartan-hydrochlorothiazide (HYZAAR) 50-12.5 MG tablet    Sig: Take 1 tablet by mouth daily.    Dispense:  30 tablet    Refill:  0    Order Specific Question:   Supervising Provider    Answer:   Doree Albee [[id number]]    Patient to follow-up in 2-3 weeks or sooner as needed.  Ailene Ards, NP

## 2021-04-14 ENCOUNTER — Telehealth (INDEPENDENT_AMBULATORY_CARE_PROVIDER_SITE_OTHER): Payer: Self-pay | Admitting: Nurse Practitioner

## 2021-04-14 ENCOUNTER — Emergency Department (HOSPITAL_COMMUNITY)
Admission: EM | Admit: 2021-04-14 | Discharge: 2021-04-14 | Disposition: A | Discharge status: Home / Self Care | Payer: BC Managed Care – PPO | Attending: Emergency Medicine | Admitting: Emergency Medicine

## 2021-04-14 ENCOUNTER — Other Ambulatory Visit: Payer: Self-pay

## 2021-04-14 ENCOUNTER — Encounter (HOSPITAL_COMMUNITY): Payer: Self-pay | Admitting: Emergency Medicine

## 2021-04-14 DIAGNOSIS — D649 Anemia, unspecified: Secondary | ICD-10-CM

## 2021-04-14 DIAGNOSIS — I10 Essential (primary) hypertension: Secondary | ICD-10-CM | POA: Diagnosis not present

## 2021-04-14 DIAGNOSIS — Z79899 Other long term (current) drug therapy: Secondary | ICD-10-CM | POA: Insufficient documentation

## 2021-04-14 DIAGNOSIS — S6992XA Unspecified injury of left wrist, hand and finger(s), initial encounter: Secondary | ICD-10-CM | POA: Diagnosis not present

## 2021-04-14 LAB — CBC WITH DIFFERENTIAL/PLATELET
Absolute Monocytes: 459 cells/uL (ref 200–950)
Basophils Absolute: 59 cells/uL (ref 0–200)
Basophils Relative: 0.8 %
Eosinophils Absolute: 59 cells/uL (ref 15–500)
Eosinophils Relative: 0.8 %
HCT: 21.9 % — ABNORMAL LOW (ref 35.0–45.0)
Hemoglobin: 5.7 g/dL — CL (ref 11.7–15.5)
Lymphs Abs: 1539 cells/uL (ref 850–3900)
MCH: 15.5 pg — ABNORMAL LOW (ref 27.0–33.0)
MCHC: 26 g/dL — ABNORMAL LOW (ref 32.0–36.0)
MCV: 59.5 fL — ABNORMAL LOW (ref 80.0–100.0)
MPV: 10 fL (ref 7.5–12.5)
Monocytes Relative: 6.2 %
Neutro Abs: 5284 cells/uL (ref 1500–7800)
Neutrophils Relative %: 71.4 %
Platelets: 412 10*3/uL — ABNORMAL HIGH (ref 140–400)
RBC: 3.68 10*6/uL — ABNORMAL LOW (ref 3.80–5.10)
RDW: 19.4 % — ABNORMAL HIGH (ref 11.0–15.0)
Total Lymphocyte: 20.8 %
WBC: 7.4 10*3/uL (ref 3.8–10.8)

## 2021-04-14 LAB — COMPLETE METABOLIC PANEL WITH GFR
AG Ratio: 1.4 (calc) (ref 1.0–2.5)
ALT: 6 U/L (ref 6–29)
AST: 12 U/L (ref 10–30)
Albumin: 4.5 g/dL (ref 3.6–5.1)
Alkaline phosphatase (APISO): 55 U/L (ref 31–125)
BUN/Creatinine Ratio: 9 (calc) (ref 6–22)
BUN: 11 mg/dL (ref 7–25)
CO2: 27 mmol/L (ref 20–32)
Calcium: 9.5 mg/dL (ref 8.6–10.2)
Chloride: 103 mmol/L (ref 98–110)
Creat: 1.18 mg/dL — ABNORMAL HIGH (ref 0.50–1.10)
GFR, Est African American: 66 mL/min/{1.73_m2} (ref >=60)
GFR, Est Non African American: 57 mL/min/{1.73_m2} — ABNORMAL LOW (ref >=60)
Globulin: 3.3 g/dL (calc) (ref 1.9–3.7)
Glucose, Bld: 97 mg/dL (ref 65–139)
Potassium: 3.7 mmol/L (ref 3.5–5.3)
Sodium: 139 mmol/L (ref 135–146)
Total Bilirubin: 0.8 mg/dL (ref 0.2–1.2)
Total Protein: 7.8 g/dL (ref 6.1–8.1)

## 2021-04-14 LAB — BASIC METABOLIC PANEL
Anion gap: 7 (ref 5–15)
BUN: 13 mg/dL (ref 6–20)
CO2: 26 mmol/L (ref 22–32)
Calcium: 9.4 mg/dL (ref 8.9–10.3)
Chloride: 101 mmol/L (ref 98–111)
Creatinine, Ser: 1.05 mg/dL — ABNORMAL HIGH (ref 0.44–1.00)
GFR, Estimated: >60 mL/min (ref >60)
Glucose, Bld: 98 mg/dL (ref 70–99)
Potassium: 3.1 mmol/L — ABNORMAL LOW (ref 3.5–5.1)
Sodium: 134 mmol/L — ABNORMAL LOW (ref 135–145)

## 2021-04-14 LAB — CBC
HCT: 25.7 % — ABNORMAL LOW (ref 36.0–46.0)
Hemoglobin: 6.3 g/dL — CL (ref 12.0–15.0)
MCH: 15.5 pg — ABNORMAL LOW (ref 26.0–34.0)
MCHC: 24.5 g/dL — ABNORMAL LOW (ref 30.0–36.0)
MCV: 63.3 fL — ABNORMAL LOW (ref 80.0–100.0)
Platelets: 433 10*3/uL — ABNORMAL HIGH (ref 150–400)
RBC: 4.06 MIL/uL (ref 3.87–5.11)
RDW: 20.1 % — ABNORMAL HIGH (ref 11.5–15.5)
WBC: 7.7 10*3/uL (ref 4.0–10.5)
nRBC: 0 % (ref 0.0–0.2)

## 2021-04-14 LAB — HEMOGLOBIN A1C
Hgb A1c MFr Bld: 4.9 % of total Hgb (ref <5.7)
Mean Plasma Glucose: 94 mg/dL
eAG (mmol/L): 5.2 mmol/L

## 2021-04-14 LAB — PREPARE RBC (CROSSMATCH)

## 2021-04-14 LAB — ABO/RH: ABO/RH(D): A POS

## 2021-04-14 LAB — LIPID PANEL
Cholesterol: 133 mg/dL (ref <200)
HDL: 40 mg/dL — ABNORMAL LOW (ref >=50)
LDL Cholesterol (Calc): 78 mg/dL (calc)
Non-HDL Cholesterol (Calc): 93 mg/dL (calc) (ref <130)
Total CHOL/HDL Ratio: 3.3 (calc) (ref <5.0)
Triglycerides: 71 mg/dL (ref <150)

## 2021-04-14 LAB — POC OCCULT BLOOD, ED: Fecal Occult Bld: NEGATIVE

## 2021-04-14 LAB — TSH: TSH: 0.98 mIU/L

## 2021-04-14 MED ORDER — FERROUS SULFATE 325 (65 FE) MG PO TABS: 325.0000 mg | ORAL_TABLET | Freq: Every day | ORAL | 0 refills | Status: DC

## 2021-04-14 MED ORDER — SODIUM CHLORIDE 0.9 % IV SOLN
10.0000 mL/h | Freq: Once | INTRAVENOUS | Status: AC
  Administered 2021-04-14: 10 mL/h via INTRAVENOUS

## 2021-04-14 NOTE — ED Triage Notes (Signed)
Pt called by PCP for Hgb 5.7, advised to come to ED for transfusion. No other complaints at this time.

## 2021-04-14 NOTE — ED Provider Notes (Signed)
[facility] EMERGENCY DEPARTMENT Provider Note   CSN: [id number] Arrival date & time: 04/14/21  0209     History Chief Complaint  Patient presents with  . Anemia    Ashley [name] is a 45 y.o. female.  Patient presents to the emergency department for evaluation of low blood counts.  Patient had a first visit with a new doctor today and had routine blood work.  She was called at home and told that she was anemic and needed to come to the ER for a transfusion.  Patient reports that she has recently noticed that she has had some generalized weakness and dizziness with standing and walking.  No heart palpitations or shortness of breath.  No chest pain.  She has not noticed any GI bleeding.  She does report history of heavy menstrual periods and has been anemic in the past because of this.  She is not currently bleeding.        Past Medical History:  Diagnosis Date  . Hernia of abdominal cavity   . Hypertension     Patient Active Problem List   Diagnosis Date Noted  . BLOOD CHEMISTRY, ABNORMAL 01/11/2010    Past Surgical History:  Procedure Laterality Date  . CHOLECYSTECTOMY  2011  . HERNIA REPAIR     umbilical hernia  . TUBAL LIGATION  2009     OB History   No obstetric history on file.     Family History  Problem Relation Age of Onset  . Cancer Mother   . CVA Father   . Crohn's disease Father   . Hypertension Maternal Grandmother   . Diabetes Maternal Grandmother   . Dementia Maternal Grandfather   . Pneumonia Paternal Grandfather     Social History   Tobacco Use  . Smoking status: Never Smoker  . Smokeless tobacco: Never Used  Substance Use Topics  . Alcohol use: Not Currently  . Drug use: Never    Home Medications Prior to Admission medications   Medication Sig Start Date End Date Taking? Authorizing Provider  ferrous sulfate 325 (65 FE) MG tablet Take 1 tablet (325 mg total) by mouth daily. 04/14/21  Yes Aizlynn Digilio, Gwenyth Allegra, MD   losartan-hydrochlorothiazide (HYZAAR) 50-12.5 MG tablet Take 1 tablet by mouth daily. 04/13/21   Ailene Ards, NP    Allergies    Patient has no known allergies.  Review of Systems   Review of Systems  Constitutional: Positive for fatigue.  Neurological: Positive for dizziness.  All other systems reviewed and are negative.   Physical Exam Updated Vital Signs BP (!) 165/99   Pulse 65   Temp 97.7 F (36.5 C) (Oral)   Resp 18   Ht 5\' 6"  (1.676 m)   Wt 87.4 kg   LMP 04/07/2021 Comment: tubal ligation 2009  SpO2 100%   BMI 31.09 kg/m   Physical Exam Vitals and nursing note reviewed.  Constitutional:      General: She is not in acute distress.    Appearance: Normal appearance. She is well-developed.  HENT:     Head: Normocephalic and atraumatic.     Right Ear: Hearing normal.     Left Ear: Hearing normal.     Nose: Nose normal.  Eyes:     Conjunctiva/sclera: Conjunctivae normal.     Pupils: Pupils are equal, round, and reactive to light.  Cardiovascular:     Rate and Rhythm: Regular rhythm.     Heart sounds: S1 normal and S2 normal. No  murmur heard. No friction rub. No gallop.   Pulmonary:     Effort: Pulmonary effort is normal. No respiratory distress.     Breath sounds: Normal breath sounds.  Chest:     Chest wall: No tenderness.  Abdominal:     General: Bowel sounds are normal.     Palpations: Abdomen is soft.     Tenderness: There is no abdominal tenderness. There is no guarding or rebound. Negative signs include Murphy's sign and McBurney's sign.     Hernia: No hernia is present.  Musculoskeletal:        General: Normal range of motion.     Cervical back: Normal range of motion and neck supple.  Skin:    General: Skin is warm and dry.     Findings: No rash.  Neurological:     Mental Status: She is alert and oriented to person, place, and time.     GCS: GCS eye subscore is 4. GCS verbal subscore is 5. GCS motor subscore is 6.     Cranial Nerves: No  cranial nerve deficit.     Sensory: No sensory deficit.     Coordination: Coordination normal.  Psychiatric:        Speech: Speech normal.        Behavior: Behavior normal.        Thought Content: Thought content normal.     ED Results / Procedures / Treatments   Labs (all labs ordered are listed, but only abnormal results are displayed) Labs Reviewed  CBC - Abnormal; Notable for the following components:      Result Value   Hemoglobin 6.3 (*)    HCT 25.7 (*)    MCV 63.3 (*)    MCH 15.5 (*)    MCHC 24.5 (*)    RDW 20.1 (*)    Platelets 433 (*)    All other components within normal limits  BASIC METABOLIC PANEL - Abnormal; Notable for the following components:   Sodium 134 (*)    Potassium 3.1 (*)    Creatinine, Ser 1.05 (*)    All other components within normal limits  POC OCCULT BLOOD, ED  TYPE AND SCREEN  ABO/RH    EKG None  Radiology No results found.  Procedures Procedures   Medications Ordered in ED Medications - No data to display  ED Course  I have reviewed the triage vital signs and the nursing notes.  Pertinent labs & imaging results that were available during my care of the patient were reviewed by me and considered in my medical decision making (see chart for details).    MDM Rules/Calculators/A&P                          Anemia confirmed by repeat blood work in the department.  She does have mild symptoms and therefore will be transfused 1 unit.  We will then discharge on iron.  Hemoccult was negative today, source of anemia is felt to be gynecologic.  Her primary doctor has already referred her to a new OB/GYN.  Final Clinical Impression(s) / ED Diagnoses Final diagnoses:  Symptomatic anemia    Rx / DC Orders ED Discharge Orders         Ordered    ferrous sulfate 325 (65 FE) MG tablet  Daily        04/14/21 0320           Orpah Greek, MD 04/14/21 0320

## 2021-04-14 NOTE — Telephone Encounter (Signed)
Please call this patient and see if we can get her in for follow-up sooner than currently scheduled with either myself or Dr. Darnell Level for emergency department visit follow-up. Thank you.

## 2021-04-14 NOTE — ED Notes (Signed)
Date and time results received: 04/14/21 0305  Test: hgb Critical Value: 6.3  Name of Provider Notified: Betsey Holiday, MD  Orders Received? Or Actions Taken?: n/a

## 2021-04-15 LAB — BPAM RBC
Blood Product Expiration Date: 202206272359
ISSUE DATE / TIME: 202206030450
Unit Type and Rh: 6200

## 2021-04-15 LAB — TYPE AND SCREEN
ABO/RH(D): A POS
Antibody Screen: NEGATIVE
Unit division: 0

## 2021-04-18 NOTE — Telephone Encounter (Signed)
Done, she is coming in on 04/24/21 at 1:00 to see Dr Darnell Level.

## 2021-04-19 ENCOUNTER — Ambulatory Visit (HOSPITAL_COMMUNITY)
Admission: RE | Admit: 2021-04-19 | Discharge: 2021-04-19 | Disposition: A | Discharge status: Home / Self Care | Payer: BC Managed Care – PPO | Source: Ambulatory Visit | Attending: Nurse Practitioner | Admitting: Nurse Practitioner

## 2021-04-19 DIAGNOSIS — Z1231 Encounter for screening mammogram for malignant neoplasm of breast: Secondary | ICD-10-CM

## 2021-04-20 ENCOUNTER — Other Ambulatory Visit: Payer: Self-pay

## 2021-04-20 ENCOUNTER — Encounter: Payer: Self-pay | Admitting: General Surgery

## 2021-04-20 ENCOUNTER — Other Ambulatory Visit (INDEPENDENT_AMBULATORY_CARE_PROVIDER_SITE_OTHER): Payer: Self-pay | Admitting: Nurse Practitioner

## 2021-04-20 ENCOUNTER — Ambulatory Visit (INDEPENDENT_AMBULATORY_CARE_PROVIDER_SITE_OTHER): Payer: BC Managed Care – PPO | Admitting: General Surgery

## 2021-04-20 VITALS — BP 166/91 | HR 76 | Temp 98.7°F | Resp 16 | Ht 66.0 in | Wt 191.0 lb

## 2021-04-20 DIAGNOSIS — R928 Other abnormal and inconclusive findings on diagnostic imaging of breast: Secondary | ICD-10-CM

## 2021-04-20 DIAGNOSIS — K432 Incisional hernia without obstruction or gangrene: Secondary | ICD-10-CM | POA: Diagnosis not present

## 2021-04-20 NOTE — Progress Notes (Signed)
Ashley [name]; [id number]; [birth year]   HPI Patient is a 45 year old black female who was referred to my care by Jeralyn Ruths for evaluation treatment of recurrent incisional hernia.  Patient states that she has had the hernia for some time.  She had previous repair of an umbilical hernia by another surgeon in the remote past.  She has having increasing swelling and pain just above the umbilicus as well as at the umbilicus.  She recently was diagnosed with anemia of unknown etiology.  A GYN source is suspected and the patient is following up with gynecology concerning her anemia.  She denies any nausea or vomiting. Past Medical History:  Diagnosis Date   Hernia of abdominal cavity    Hypertension     Past Surgical History:  Procedure Laterality Date   CHOLECYSTECTOMY  2011   HERNIA REPAIR     umbilical hernia   TUBAL LIGATION  2009    Family History  Problem Relation Age of Onset   Cancer Mother    CVA Father    Crohn's disease Father    Hypertension Maternal Grandmother    Diabetes Maternal Grandmother    Dementia Maternal Grandfather    Pneumonia Paternal Grandfather     Current Outpatient Medications on File Prior to Visit  Medication Sig Dispense Refill   ferrous sulfate 325 (65 FE) MG tablet Take 1 tablet (325 mg total) by mouth daily. 30 tablet 0   losartan-hydrochlorothiazide (HYZAAR) 50-12.5 MG tablet Take 1 tablet by mouth daily. 30 tablet 0   No current facility-administered medications on file prior to visit.    No Known Allergies  Social History   Substance and Sexual Activity  Alcohol Use Not Currently    Social History   Tobacco Use  Smoking Status Never  Smokeless Tobacco Never    Review of Systems  Constitutional: Negative.   HENT: Negative.    Eyes: Negative.   Respiratory: Negative.    Cardiovascular: Negative.   Gastrointestinal:  Positive for abdominal pain.  Genitourinary: Negative.   Musculoskeletal: Negative.   Skin: Negative.    Neurological: Negative.   Endo/Heme/Allergies: Negative.   Psychiatric/Behavioral: Negative.     Objective   Vitals:   04/20/21 1003  BP: (!) 166/91  Pulse: 76  Resp: 16  Temp: 98.7 F (37.1 C)  SpO2: 98%    Physical Exam Vitals reviewed.  Constitutional:      Appearance: Normal appearance. She is not ill-appearing.  HENT:     Head: Normocephalic and atraumatic.  Cardiovascular:     Rate and Rhythm: Normal rate and regular rhythm.     Heart sounds: Normal heart sounds. No murmur heard.   No friction rub. No gallop.  Pulmonary:     Effort: Pulmonary effort is normal. No respiratory distress.     Breath sounds: Normal breath sounds. No stridor. No wheezing, rhonchi or rales.  Abdominal:     General: Bowel sounds are normal. There is no distension.     Palpations: Abdomen is soft. There is no mass.     Tenderness: There is no abdominal tenderness. There is no guarding or rebound.     Hernia: A hernia is present.     Comments: Two reducible ventral wall hernias, a 4 cm hernia just superior to the umbilicus and one at the umbilicus.  Surgical scars present.  Skin:    General: Skin is warm and dry.  Neurological:     Mental Status: She is alert and oriented to person,  place, and time.  ER note reviewed  Assessment  Incisional hernia Anemia of unknown etiology, work-up pending Plan  I told the patient that her hernias could be fixed with a laparoscopic approach.  This can only be done after her anemia work-up has been completed.  She understands and agrees.  She will return after her anemia has been addressed.

## 2021-04-20 NOTE — Patient Instructions (Signed)
Trude Mcburney Textbook of Surgery (20th ed., pp. [phone]). Elsevier.">  Laparoscopic Ventral Hernia Repair Laparoscopic ventral hernia repairis a procedure to fix a bulge of tissue that pushes through a weak area of muscle in the abdomen (ventral hernia). A ventral hernia may be: Above the belly button. This is called an epigastric hernia. At the belly button. This is called an umbilical hernia. At the incision site from previous abdominal surgery. This is called an incisional hernia. You may have this procedure as emergency surgery if part of your intestine gets trapped inside the hernia and starts to lose its blood supply (strangulation). Laparoscopic surgery is done through small incisions using a thin surgical telescope with a light and camera (laparoscope). During surgery, your surgeon will use images from the laparoscope to guide the procedure. A mesh screen will be placed in the hernia to close the opening and strengthen the abdominal wall. Tell a health care provider about: Any allergies you have. All medicines you are taking, including vitamins, herbs, eye drops, creams, and over-the-counter medicines. Any problems you or family members have had with anesthetic medicines. Any blood disorders you have. Any surgeries you have had. Any medical conditions you have. Whether you are pregnant or may be pregnant. What are the risks? Generally, this is a safe procedure. However, problems may occur, including: Infection. Bleeding. Damage to nearby structures or organs in the abdomen. Trouble urinating or having a bowel movement after surgery. Blood clots. The hernia coming back after surgery. Fluid buildup in the area of the hernia. In some cases, your health care provider may need to switch from a laparoscopic procedure to a procedure that is done through a single, larger incision in the abdomen (open procedure). You may need an open procedure if: You have a hernia that is difficult to  repair. Your organs are hard to see with the laparoscope. You have bleeding problems during the laparoscopic procedure. What happens before the procedure? Staying hydrated Follow instructions from your health care provider about hydration, which may include: Up to 2 hours before the procedure - you may continue to drink clear liquids, such as water, clear fruit juice, black coffee, and plain tea.   Eating and drinking restrictions Follow instructions from your health care provider about eating and drinking, which may include: 8 hours before the procedure - stop eating heavy meals or foods, such as meat, fried foods, or fatty foods. 6 hours before the procedure - stop eating light meals or foods, such as toast or cereal. 6 hours before the procedure - stop drinking milk or drinks that contain milk. 2 hours before the procedure - stop drinking clear liquids. Medicines Ask your health care provider about: Changing or stopping your regular medicines. This is especially important if you are taking diabetes medicines or blood thinners. Taking medicines such as aspirin and ibuprofen. These medicines can thin your blood. Do not take these medicines unless your health care provider tells you to take them. Taking over-the-counter medicines, vitamins, herbs, and supplements. Tests You may need to have tests before the procedure, such as: Blood tests. Urine tests. Abdominal ultrasound. Chest X-ray. Electrocardiogram (ECG). General instructions You may be asked to take a laxative or do an enema to empty your bowel before surgery (bowel prep). Do not use any products that contain nicotine or tobacco for at least 4 weeks before the procedure. These products include cigarettes, chewing tobacco, and vaping devices, such as e-cigarettes. If you need help quitting, ask your health care provider. Ask  your health care provider: How your surgery site will be marked. What steps will be taken to help prevent  infection. These steps may include: Removing hair at the surgery site. Washing skin with a germ-killing soap. Receiving antibiotic medicine. Plan to have a responsible adult take you home from the hospital or clinic. If you will be going home right after the procedure, plan to have a responsible adult care for you for the time you are told. This is important. What happens during the procedure? An IV will be inserted into one of your veins. You will be given one or more of the following: A medicine to help you relax (sedative). A medicine to numb the area (local anesthetic). A medicine to make you fall asleep (general anesthetic). A small incision will be made in your abdomen. A hollow metal tube (trocar) will be placed through the incision. A tube will be placed through the trocar to inflate your abdomen with carbon dioxide. This makes it easier for your surgeon to see inside your abdomen during the repair. A laparoscope will be inserted into your abdomen through the trocar. The laparoscope will send images to a monitor in the operating room. Other trocars will be put through other small incisions in your abdomen. The surgical instruments needed for the procedure will be placed through these trocars. The tissue or intestines that make up the hernia will be moved back into place. The edges of the hernia may be stitched (sutured) together. A piece of mesh will be used to close the hernia. Sutures, clips, or staples will be used to keep the mesh in place. A bandage (dressing) or skin glue will be put over the incisions. The procedure may vary among health care providers and hospitals.   What happens after the procedure? Your blood pressure, heart rate, breathing rate, and blood oxygen level will be monitored until you leave the hospital or clinic. You will continue to receive fluids and medicines through an IV. Your IV will be removed when you can drink clear fluids. You will be given pain  medicine as needed. You will be encouraged to get up and walk around as soon as possible. You will be shown how to do deep breathing exercises to help prevent a lung infection. If you were given a sedative during the procedure, it can affect you for several hours. Do not drive or operate machinery until your health care provider says that it is safe. Summary Laparoscopic ventral hernia is an operation to fix a hernia using small incisions. Tell your health care provider about other medical conditions that you have and about all the medicines that you are taking. Follow instructions from your health care provider about eating and drinking before the procedure. Plan to have a responsible adult take you home from the hospital or clinic. After the procedure, you will be encouraged to walk as soon as possible. You will also be taught how to do deep breathing exercises. This information is not intended to replace advice given to you by your health care provider. Make sure you discuss any questions you have with your health care provider. Document Revised: 06/17/2020 Document Reviewed: 06/17/2020 Elsevier Patient Education  2021 [place]. Ventral Hernia  A ventral hernia is a bulge of tissue from inside the abdomen that pushes through a weak area of the muscles that form the front wall of the abdomen. The tissues inside the abdomen are inside a sac (peritoneum). These tissues include the small intestine, large intestine,  and the fatty tissue that covers the intestines (omentum). Sometimes, the bulge that forms a hernia contains intestines. Other hernias contain only fat. Ventral hernias do not go away without surgical treatment. There are several types of ventral hernias. You may have: A hernia at an incision site from previous abdominal surgery (incisional hernia). A hernia just above the belly button (epigastric hernia), or at the belly button (umbilical hernia). These types of hernias can develop  from heavy lifting or straining. A hernia that comes and goes (reducible hernia). It may be visible only when you lift or strain. This type of hernia can be pushed back into the abdomen (reduced). A hernia that traps abdominal tissue inside the hernia (incarcerated hernia). This type of hernia does not reduce. A hernia that cuts off blood flow to the tissues inside the hernia (strangulated hernia). The tissues can start to die if this happens. This is a very painful bulge that cannot be reduced. A strangulated hernia is a medical emergency. What are the causes? This condition is caused by abdominal tissue putting pressure on an area of weakness in the abdominal muscles. What increases the risk? The following factors may make you more likely to develop this condition: Being age 18 or older. Being overweight or obese. Having had previous abdominal surgery, especially if there was an infection after surgery. Having had an injury to the abdominal wall. Frequently lifting or pushing heavy objects. Having had several pregnancies. Having a buildup of fluid inside the abdomen (ascites). Straining to have a bowel movement or to urinate. Having frequent coughing episodes. What are the signs or symptoms? The only symptom of a ventral hernia may be a painless bulge in the abdomen. A reducible hernia may be visible only when you strain, cough, or lift. Other symptoms may include: Dull pain. A feeling of pressure. Signs and symptoms of a strangulated hernia may include: Increasing pain. Nausea and vomiting. Pain when pressing on the hernia. The skin over the hernia turning red or purple. Constipation. Blood in the stool (feces). How is this diagnosed? This condition may be diagnosed based on: Your symptoms. Your medical history. A physical exam. You may be asked to cough or strain while standing. These actions increase the pressure inside your abdomen and force the hernia through the opening in  your muscles. Your health care provider may try to reduce the hernia by gently pushing the hernia back in. Imaging studies, such as an ultrasound or CT scan. How is this treated? This condition is treated with surgery. If you have a strangulated hernia, surgery is done as soon as possible. If your hernia is small and not incarcerated, you may be asked to lose some weight before surgery. Follow these instructions at home: Follow instructions from your health care provider about eating or drinking restrictions. If you are overweight, your health care provider may recommend that you increase your activity level and eat a healthier diet. Do not lift anything that is heavier than 10 lb (4.5 kg), or the limit that you are told, until your health care provider says that it is safe. Return to your normal activities as told by your health care provider. Ask your health care provider what activities are safe for you. You may need to avoid activities that increase pressure on your hernia. Take over-the-counter and prescription medicines only as told by your health care provider. Keep all follow-up visits. This is important. Contact a health care provider if: Your hernia gets larger. Your hernia becomes  painful. Get help right away if: Your hernia becomes increasingly painful. You have pain along with any of the following: Changes in skin color in the area of the hernia. Nausea. Vomiting. Fever. These symptoms may represent a serious problem that is an emergency. Do not wait to see if the symptoms will go away. Get medical help right away. Call your local emergency services (911 in the [place] Do not drive yourself to the hospital. Summary A ventral hernia is a bulge of tissue from inside the abdomen that pushes through a weak area of the muscles that form the front wall of the abdomen. This condition is treated with surgery, which may be urgent depending on your hernia. Do not lift anything that is  heavier than 10 lb (4.5 kg), and follow activity instructions from your health care provider. This information is not intended to replace advice given to you by your health care provider. Make sure you discuss any questions you have with your health care provider. Document Revised: 06/17/2020 Document Reviewed: 06/17/2020 Elsevier Patient Education  2021 [employer].

## 2021-04-21 ENCOUNTER — Other Ambulatory Visit (HOSPITAL_COMMUNITY): Payer: Self-pay | Admitting: Nurse Practitioner

## 2021-04-24 ENCOUNTER — Ambulatory Visit (INDEPENDENT_AMBULATORY_CARE_PROVIDER_SITE_OTHER): Payer: BC Managed Care – PPO | Admitting: Internal Medicine

## 2021-04-24 ENCOUNTER — Encounter (INDEPENDENT_AMBULATORY_CARE_PROVIDER_SITE_OTHER): Payer: Self-pay | Admitting: Internal Medicine

## 2021-04-24 ENCOUNTER — Other Ambulatory Visit: Payer: Self-pay

## 2021-04-24 VITALS — BP 128/86 | HR 72 | Temp 97.7°F | Ht 66.0 in | Wt 194.2 lb

## 2021-04-24 DIAGNOSIS — N920 Excessive and frequent menstruation with regular cycle: Secondary | ICD-10-CM

## 2021-04-24 DIAGNOSIS — E876 Hypokalemia: Secondary | ICD-10-CM

## 2021-04-24 DIAGNOSIS — D500 Iron deficiency anemia secondary to blood loss (chronic): Secondary | ICD-10-CM | POA: Diagnosis not present

## 2021-04-24 NOTE — Progress Notes (Signed)
Metrics: Intervention Frequency ACO  Documented Smoking Status Yearly  Screened one or more times in 24 months  Cessation Counseling or  Active cessation medication Past 24 months  Past 24 months   Guideline developer: UpToDate (See UpToDate for funding source) Date Released: 2014       Wellness Office Visit  Subjective:  Patient ID: Ashley [name], female    DOB: [birth year]  Age: 45 y.o. MRN: [id number]  CC: Iron deficiency anemia HPI  This lady was seen by Judson Roch recently as a new patient to establish care and routine blood work showed very low hemoglobin.  She was then sent to the emergency room and received 1 unit blood transfusion.  She does describe menorrhagia.  Her cycles are regular but bleeding is very heavy.  She is due to see gynecology in early July for further recommendations.  She was sent home from the emergency room with iron supplementation, once a day. Past Medical History:  Diagnosis Date   Hernia of abdominal cavity    Hypertension    Past Surgical History:  Procedure Laterality Date   CHOLECYSTECTOMY  2011   HERNIA REPAIR     umbilical hernia   TUBAL LIGATION  2009     Family History  Problem Relation Age of Onset   Cancer Mother    CVA Father    Crohn's disease Father    Hypertension Maternal Grandmother    Diabetes Maternal Grandmother    Dementia Maternal Grandfather    Pneumonia Paternal Grandfather     Social History   Social History Narrative   Married since June 2004.Lives with husband and 3 girls.Works as Estate manager/land agent.   Social History   Tobacco Use   Smoking status: Never   Smokeless tobacco: Never  Substance Use Topics   Alcohol use: Not Currently    Current Meds  Medication Sig   ferrous sulfate 325 (65 FE) MG tablet Take 1 tablet (325 mg total) by mouth daily.   losartan-hydrochlorothiazide (HYZAAR) 50-12.5 MG tablet Take 1 tablet by mouth daily.     [place] Office Visit from 04/24/2021 in [facility]  PHQ-9 Total Score 0       Objective:   Today's Vitals: BP 128/86   Pulse 72   Temp 97.7 F (36.5 C) (Temporal)   Ht 5\' 6"  (1.676 m)   Wt 194 lb 3.2 oz (88.1 kg)   LMP 04/07/2021 Comment: tubal ligation 2009  SpO2 98%   BMI 31.34 kg/m  Vitals with BMI 04/24/2021 04/20/2021 04/14/2021  Height 5\' 6"  5\' 6"  -  Weight 194 lbs 3 oz 191 lbs -  BMI 86.76 19.50 -  Systolic 932 671 245  Diastolic 86 91 93  Pulse 72 76 71     Physical Exam  She looks systemically well.  No new physical findings.     Assessment   1. Iron deficiency anemia due to chronic blood loss   2. Menorrhagia with regular cycle   3. Hypokalemia       Tests ordered Orders Placed This Encounter  Procedures   CBC   COMPLETE METABOLIC PANEL WITH GFR      Plan: 1.  We will check a CBC again and see where her hemoglobin is now.  This will direct [place] as to how much iron supplementation she will need. 2.  Check electrolytes again because her potassium was low previously. 3.  Follow-up with gynecology. 4.  Follow-up with Sarah in about 6 weeks time.  No orders of the defined types were placed in this encounter.   Doree Albee, MD

## 2021-04-25 ENCOUNTER — Encounter (INDEPENDENT_AMBULATORY_CARE_PROVIDER_SITE_OTHER): Payer: Self-pay | Admitting: Internal Medicine

## 2021-04-25 ENCOUNTER — Ambulatory Visit (HOSPITAL_COMMUNITY)
Admission: RE | Admit: 2021-04-25 | Discharge: 2021-04-25 | Disposition: A | Discharge status: Home / Self Care | Payer: BC Managed Care – PPO | Source: Ambulatory Visit | Attending: Nurse Practitioner | Admitting: Nurse Practitioner

## 2021-04-25 DIAGNOSIS — N6322 Unspecified lump in the left breast, upper inner quadrant: Secondary | ICD-10-CM | POA: Diagnosis not present

## 2021-04-25 DIAGNOSIS — N6321 Unspecified lump in the left breast, upper outer quadrant: Secondary | ICD-10-CM | POA: Diagnosis not present

## 2021-04-25 DIAGNOSIS — R928 Other abnormal and inconclusive findings on diagnostic imaging of breast: Secondary | ICD-10-CM

## 2021-04-25 LAB — COMPLETE METABOLIC PANEL WITH GFR
AG Ratio: 1.2 (calc) (ref 1.0–2.5)
ALT: 4 U/L — ABNORMAL LOW (ref 6–29)
AST: 13 U/L (ref 10–30)
Albumin: 4.3 g/dL (ref 3.6–5.1)
Alkaline phosphatase (APISO): 55 U/L (ref 31–125)
BUN: 13 mg/dL (ref 7–25)
CO2: 25 mmol/L (ref 20–32)
Calcium: 9.7 mg/dL (ref 8.6–10.2)
Chloride: 103 mmol/L (ref 98–110)
Creat: 1.02 mg/dL (ref 0.50–1.10)
GFR, Est African American: 79 mL/min/{1.73_m2} (ref >=60)
GFR, Est Non African American: 68 mL/min/{1.73_m2} (ref >=60)
Globulin: 3.5 g/dL (calc) (ref 1.9–3.7)
Glucose, Bld: 86 mg/dL (ref 65–139)
Potassium: 3.9 mmol/L (ref 3.5–5.3)
Sodium: 135 mmol/L (ref 135–146)
Total Bilirubin: 0.7 mg/dL (ref 0.2–1.2)
Total Protein: 7.8 g/dL (ref 6.1–8.1)

## 2021-04-25 LAB — CBC
HCT: 29.4 % — ABNORMAL LOW (ref 35.0–45.0)
Hemoglobin: 8.1 g/dL — ABNORMAL LOW (ref 11.7–15.5)
MCH: 18.5 pg — ABNORMAL LOW (ref 27.0–33.0)
MCHC: 27.6 g/dL — ABNORMAL LOW (ref 32.0–36.0)
MCV: 67.3 fL — ABNORMAL LOW (ref 80.0–100.0)
MPV: 10.1 fL (ref 7.5–12.5)
Platelets: 356 10*3/uL (ref 140–400)
RBC: 4.37 10*6/uL (ref 3.80–5.10)
RDW: 26.2 % — ABNORMAL HIGH (ref 11.0–15.0)
WBC: 7.9 10*3/uL (ref 3.8–10.8)

## 2021-05-08 ENCOUNTER — Other Ambulatory Visit (INDEPENDENT_AMBULATORY_CARE_PROVIDER_SITE_OTHER): Payer: Self-pay | Admitting: Internal Medicine

## 2021-05-08 MED ORDER — FERROUS SULFATE 325 (65 FE) MG PO TABS: 325.0000 mg | ORAL_TABLET | Freq: Two times a day (BID) | ORAL | 3 refills | Status: DC

## 2021-05-10 ENCOUNTER — Ambulatory Visit (INDEPENDENT_AMBULATORY_CARE_PROVIDER_SITE_OTHER): Payer: BC Managed Care – PPO | Admitting: Nurse Practitioner

## 2021-05-16 ENCOUNTER — Encounter: Payer: Self-pay | Admitting: Obstetrics & Gynecology

## 2021-05-16 ENCOUNTER — Other Ambulatory Visit (HOSPITAL_COMMUNITY)
Admission: RE | Admit: 2021-05-16 | Discharge: 2021-05-16 | Disposition: A | Discharge status: Home / Self Care | Payer: BC Managed Care – PPO | Source: Ambulatory Visit | Attending: Obstetrics & Gynecology | Admitting: Obstetrics & Gynecology

## 2021-05-16 ENCOUNTER — Other Ambulatory Visit: Payer: Self-pay

## 2021-05-16 ENCOUNTER — Ambulatory Visit (INDEPENDENT_AMBULATORY_CARE_PROVIDER_SITE_OTHER): Payer: BC Managed Care – PPO | Admitting: Obstetrics & Gynecology

## 2021-05-16 VITALS — BP 153/92 | HR 66 | Ht 66.0 in | Wt 197.0 lb

## 2021-05-16 DIAGNOSIS — D500 Iron deficiency anemia secondary to blood loss (chronic): Secondary | ICD-10-CM | POA: Diagnosis not present

## 2021-05-16 DIAGNOSIS — N920 Excessive and frequent menstruation with regular cycle: Secondary | ICD-10-CM | POA: Diagnosis not present

## 2021-05-16 DIAGNOSIS — Z01419 Encounter for gynecological examination (general) (routine) without abnormal findings: Secondary | ICD-10-CM | POA: Insufficient documentation

## 2021-05-16 DIAGNOSIS — D219 Benign neoplasm of connective and other soft tissue, unspecified: Secondary | ICD-10-CM | POA: Diagnosis not present

## 2021-05-16 LAB — POCT HEMOGLOBIN: Hemoglobin: 9.2 g/dL — AB (ref 11–14.6)

## 2021-05-16 MED ORDER — MEGESTROL ACETATE 40 MG PO TABS: ORAL_TABLET | ORAL | 3 refills | Status: DC

## 2021-05-16 MED ORDER — MYFEMBREE 40-1-0.5 MG PO TABS
1.0000 | ORAL_TABLET | Freq: Every day | ORAL | 11 refills | Status: DC
Start: 2021-05-16 — End: 2021-06-29

## 2021-05-16 NOTE — Progress Notes (Signed)
Chief Complaint  Patient presents with   Gynecologic Exam      45 y.o. [phone] Patient's last menstrual period was 04/30/2021 (exact date). The current method of family planning is tubal ligation.  Outpatient Encounter Medications as of 05/16/2021  Medication Sig   ferrous sulfate 325 (65 FE) MG tablet Take 1 tablet (325 mg total) by mouth 2 (two) times daily with a meal.   losartan-hydrochlorothiazide (HYZAAR) 50-12.5 MG tablet Take 1 tablet by mouth daily.   megestrol (MEGACE) 40 MG tablet 3 tablets a day for 5 days, 2 tablets a day for 5 days then 1 tablet daily   Relugolix-Estradiol-Norethind (MYFEMBREE) 40-1-0.5 MG TABS Take 1 tablet by mouth daily.   No facility-administered encounter medications on file as of 05/16/2021.    Subjective Pt with heavy periods forever, clots, very heavy, wears 2 pads and a tampon No pain Hemoglobin 6.3 recently, transfused 1 unit 3rd day a flood  Past Medical History:  Diagnosis Date   Hernia of abdominal cavity    Hypertension     Past Surgical History:  Procedure Laterality Date   CHOLECYSTECTOMY  2011   HERNIA REPAIR     umbilical hernia   TUBAL LIGATION  2009    OB History     Gravida  3   Para  3   Term  3   Preterm      AB      Living  3      SAB      IAB      Ectopic      Multiple      Live Births  3           No Known Allergies  Social History   Socioeconomic History   Marital status: Married    Spouse name: Not on file   Number of children: Not on file   Years of education: Not on file   Highest education level: Not on file  Occupational History   Not on file  Tobacco Use   Smoking status: Never   Smokeless tobacco: Never  Vaping Use   Vaping Use: Never used  Substance and Sexual Activity   Alcohol use: Not Currently   Drug use: Never   Sexual activity: Yes    Birth control/protection: Surgical  Other Topics Concern   Not on file  Social History Narrative   Married since  June 2004.Lives with husband and 3 girls.Works as Estate manager/land agent.   Social Determinants of Health   Financial Resource Strain: Medium Risk   Difficulty of Paying Living Expenses: Somewhat hard  Food Insecurity: No Food Insecurity   Worried About Charity fundraiser in the Last Year: Never true   Ran Out of Food in the Last Year: Never true  Transportation Needs: No Transportation Needs   Lack of Transportation (Medical): No   Lack of Transportation (Non-Medical): No  Physical Activity: Inactive   Days of Exercise per Week: 0 days   Minutes of Exercise per Session: 0 min  Stress: No Stress Concern Present   Feeling of Stress : Not at all  Social Connections: Socially Integrated   Frequency of Communication with Friends and Family: More than three times a week   Frequency of Social Gatherings with Friends and Family: More than three times a week   Attends Religious Services: More than 4 times per year   Active Member of [employer] or Organizations: Yes   Attends Club or  Organization Meetings: More than 4 times per year   Marital Status: Married    Family History  Problem Relation Age of Onset   Cancer Mother    CVA Father    Crohn's disease Father    Hypertension Maternal Grandmother    Diabetes Maternal Grandmother    Dementia Maternal Grandfather    Pneumonia Paternal Grandfather     Medications:       Current Outpatient Medications:    ferrous sulfate 325 (65 FE) MG tablet, Take 1 tablet (325 mg total) by mouth 2 (two) times daily with a meal., Disp: 60 tablet, Rfl: 3   losartan-hydrochlorothiazide (HYZAAR) 50-12.5 MG tablet, Take 1 tablet by mouth daily., Disp: 30 tablet, Rfl: 0   megestrol (MEGACE) 40 MG tablet, 3 tablets a day for 5 days, 2 tablets a day for 5 days then 1 tablet daily, Disp: 45 tablet, Rfl: 3   Relugolix-Estradiol-Norethind (MYFEMBREE) 40-1-0.5 MG TABS, Take 1 tablet by mouth daily., Disp: 30 tablet, Rfl: 11  Objective Blood pressure (!) 153/92, pulse  66, height 5\' 6"  (1.676 m), weight 197 lb (89.4 kg), last menstrual period 04/30/2021.  General WDWN female NAD Vulva:  normal appearing vulva with no masses, tenderness or lesions Vagina:  normal mucosa, no discharge Cervix:  Normal no lesions Uterus: 20 week fibroid uterus Adnexa: ovaries:present,  normal adnexa in size, nontender and no masses   Pertinent ROS No burning with urination, frequency or urgency No nausea, vomiting or diarrhea Nor fever chills or other constitutional symptoms   Labs or studies reviewed    Impression Diagnoses this Encounter::   ICD-10-CM   1. Fibroids, multiple, 20 weeks size  D21.9 US PELVIS (TRANSABDOMINAL ONLY)    2. Iron deficiency anemia due to chronic blood loss, menstrual periods  D50.0 POCT hemoglobin    US PELVIS (TRANSABDOMINAL ONLY)    3. Encounter for gynecological examination with Papanicolaou smear of cervix  Z01.419 Cytology - PAP( [facility])    4. Menorrhagia with regular cycle  N92.0 US PELVIS (TRANSABDOMINAL ONLY)      Established relevant diagnosis(es):   Plan/Recommendations: Meds ordered this encounter  Medications   Relugolix-Estradiol-Norethind (MYFEMBREE) 40-1-0.5 MG TABS    Sig: Take 1 tablet by mouth daily.    Dispense:  30 tablet    Refill:  11   megestrol (MEGACE) 40 MG tablet    Sig: 3 tablets a day for 5 days, 2 tablets a day for 5 days then 1 tablet daily    Dispense:  45 tablet    Refill:  3    Labs or Scans Ordered: Orders Placed This Encounter  Procedures   US PELVIS (TRANSABDOMINAL ONLY)   POCT hemoglobin    Management:: Myfembree to hoepfully make amenorrheic, megestrol the first month as well  Sonogram 6 weeks to document fibroids and size  Anticipate hysterectomy along with Dr Arnoldo Morale to repair her ventral hernia at the same time  Try to get hemoglobin 10-12 range pre op  Follow up Return in about 6 weeks (around 06/27/2021) for GYN sono, Follow up, with Dr Elonda Husky.       All  questions were answered.

## 2021-05-25 LAB — CYTOLOGY - PAP
Comment: NEGATIVE
Diagnosis: NEGATIVE
High risk HPV: NEGATIVE

## 2021-06-07 ENCOUNTER — Other Ambulatory Visit: Payer: Self-pay

## 2021-06-07 ENCOUNTER — Ambulatory Visit (INDEPENDENT_AMBULATORY_CARE_PROVIDER_SITE_OTHER): Payer: BC Managed Care – PPO | Admitting: Nurse Practitioner

## 2021-06-07 ENCOUNTER — Encounter (INDEPENDENT_AMBULATORY_CARE_PROVIDER_SITE_OTHER): Payer: Self-pay | Admitting: Nurse Practitioner

## 2021-06-07 VITALS — BP 150/90 | HR 68 | Temp 97.9°F | Ht 66.0 in | Wt 194.8 lb

## 2021-06-07 DIAGNOSIS — D649 Anemia, unspecified: Secondary | ICD-10-CM

## 2021-06-07 DIAGNOSIS — I10 Essential (primary) hypertension: Secondary | ICD-10-CM

## 2021-06-07 MED ORDER — LOSARTAN POTASSIUM-HCTZ 100-12.5 MG PO TABS: 1.0000 | ORAL_TABLET | Freq: Every day | ORAL | 0 refills | Status: DC

## 2021-06-07 NOTE — Progress Notes (Signed)
Subjective:  Patient ID: Ashley [name], female    DOB: 06-13-1976  Age: 45 y.o. MRN: [id number]  CC:  Chief Complaint  Patient presents with   Follow-up    Wants to make sure her labs are okay, went to OBGYN   Anemia   Hypertension      HPI  This patient arrives today for the above.  Anemia: At her initial office visit blood work was taken and she was found to be severely anemic with a hemoglobin of less than 6.  She was sent to the hospital and was transfused with 1 unit of packed red blood cell.  Since then she has been evaluated by OB/GYN and they have started her on iron supplementation as well as Mega Strahl and Myfembree for heavy periods.  Her last menstrual period started just within the last week and she tells me it is much lighter in volume.  She had hemoglobin rechecked a couple weeks ago by her OB/GYN and it was above 9.  Ultimately treatment plan is to have her undergo hysterectomy at the same time that she has gastrointestinal hernia repair.  Hypertension: She continues on losartan-hydrochlorothiazide 50-12.5 mg daily.  She tolerated medication well but does tell me she has frequent headaches.  She does check her blood pressure at home and tells me that her systolic blood pressure is generally around 150.  Of note, on her initial blood work she did have mildly reduced kidney function.  She is due to discuss this today.  Past Medical History:  Diagnosis Date   Hernia of abdominal cavity    Hypertension       Family History  Problem Relation Age of Onset   Cancer Mother    CVA Father    Crohn's disease Father    Hypertension Maternal Grandmother    Diabetes Maternal Grandmother    Dementia Maternal Grandfather    Pneumonia Paternal Grandfather     Social History   Social History Narrative   Married since June 2004.Lives with husband and 3 girls.Works as Estate manager/land agent.   Social History   Tobacco Use   Smoking status: Never   Smokeless  tobacco: Never  Substance Use Topics   Alcohol use: Not Currently     Current Meds  Medication Sig   ferrous sulfate 325 (65 FE) MG tablet Take 1 tablet (325 mg total) by mouth 2 (two) times daily with a meal.   losartan-hydrochlorothiazide (HYZAAR) 100-12.5 MG tablet Take 1 tablet by mouth daily.   megestrol (MEGACE) 40 MG tablet 3 tablets a day for 5 days, 2 tablets a day for 5 days then 1 tablet daily   Relugolix-Estradiol-Norethind (MYFEMBREE) 40-1-0.5 MG TABS Take 1 tablet by mouth daily.   [DISCONTINUED] losartan-hydrochlorothiazide (HYZAAR) 50-12.5 MG tablet Take 1 tablet by mouth daily.    ROS:  Review of Systems  Constitutional:  Negative for fever and weight loss.  Respiratory:  Negative for shortness of breath.   Cardiovascular:  Negative for chest pain and palpitations.  Gastrointestinal:  Negative for blood in stool and melena.  Neurological:  Positive for dizziness (with position changes intermittently) and headaches. Negative for loss of consciousness.    Objective:   Today's Vitals: BP (!) 150/90   Pulse 68   Temp 97.9 F (36.6 C) (Temporal)   Ht 5' 6"  (1.676 m)   Wt 194 lb 12.8 oz (88.4 kg)   LMP 06/05/2021 (Approximate)   SpO2 99%   BMI 31.44 kg/m  Vitals with BMI 06/07/2021 05/16/2021 04/24/2021  Height 5' 6"  5' 6"  5' 6"   Weight 194 lbs 13 oz 197 lbs 194 lbs 3 oz  BMI 31.46 88.75 79.72  Systolic 820 601 561  Diastolic 90 92 86  Pulse 68 66 72     Physical Exam Vitals reviewed.  Constitutional:      General: She is not in acute distress.    Appearance: Normal appearance.  HENT:     Head: Normocephalic and atraumatic.  Neck:     Vascular: No carotid bruit.  Cardiovascular:     Rate and Rhythm: Normal rate and regular rhythm.     Pulses: Normal pulses.     Heart sounds: Normal heart sounds.  Pulmonary:     Effort: Pulmonary effort is normal.     Breath sounds: Normal breath sounds.  Skin:    General: Skin is warm and dry.  Neurological:      General: No focal deficit present.     Mental Status: She is alert and oriented to person, place, and time.  Psychiatric:        Mood and Affect: Mood normal.        Behavior: Behavior normal.        Judgment: Judgment normal.         Assessment and Plan   1. Anemia, unspecified type   2. Hypertension, unspecified type      Plan: 1.  Bleeding seems to be a bit lighter for her menstrual cycle this time around.  She is not having any significant symptoms of anemia for now.  We will hold off on rechecking hemoglobin until she comes back in a couple weeks for blood pressure and metabolic panel recheck. 2.  Blood pressure still above goal we will increase her losartan hydrochlorothiazide to 100-12.5 mg daily.  She will return the office in about 7 to 14 days for blood pressure check as well as to have metabolic panel rate checked.  We also discussed a reduced EGFR which was 79 at last metabolic panel checked.  We discussed ways to help support her kidney function in general such as maintaining hydration, improving her anemia, and controlling her blood pressure.  We will recheck metabolic panel at next office visit.  Tests ordered No orders of the defined types were placed in this encounter.     Meds ordered this encounter  Medications   losartan-hydrochlorothiazide (HYZAAR) 100-12.5 MG tablet    Sig: Take 1 tablet by mouth daily.    Dispense:  90 tablet    Refill:  0    Order Specific Question:   Supervising Provider    Answer:   Doree Albee [[id number]]    Patient to follow-up in 7 to 14 days, or sooner as needed.  Ailene Ards, NP

## 2021-06-14 ENCOUNTER — Encounter (INDEPENDENT_AMBULATORY_CARE_PROVIDER_SITE_OTHER): Payer: Self-pay | Admitting: Nurse Practitioner

## 2021-06-15 ENCOUNTER — Ambulatory Visit (INDEPENDENT_AMBULATORY_CARE_PROVIDER_SITE_OTHER): Payer: BC Managed Care – PPO | Admitting: Nurse Practitioner

## 2021-06-20 ENCOUNTER — Ambulatory Visit (INDEPENDENT_AMBULATORY_CARE_PROVIDER_SITE_OTHER): Payer: BC Managed Care – PPO

## 2021-06-20 ENCOUNTER — Other Ambulatory Visit: Payer: Self-pay

## 2021-06-20 ENCOUNTER — Encounter: Payer: Self-pay | Admitting: Obstetrics & Gynecology

## 2021-06-20 ENCOUNTER — Ambulatory Visit (INDEPENDENT_AMBULATORY_CARE_PROVIDER_SITE_OTHER): Payer: BC Managed Care – PPO | Admitting: Obstetrics & Gynecology

## 2021-06-20 VITALS — BP 156/90 | HR 84 | Ht 66.0 in | Wt 193.0 lb

## 2021-06-20 DIAGNOSIS — D219 Benign neoplasm of connective and other soft tissue, unspecified: Secondary | ICD-10-CM

## 2021-06-20 DIAGNOSIS — D500 Iron deficiency anemia secondary to blood loss (chronic): Secondary | ICD-10-CM

## 2021-06-20 DIAGNOSIS — N920 Excessive and frequent menstruation with regular cycle: Secondary | ICD-10-CM | POA: Diagnosis not present

## 2021-06-20 LAB — POCT HEMOGLOBIN: Hemoglobin: 9.3 g/dL — AB (ref 11–14.6)

## 2021-06-20 NOTE — Progress Notes (Signed)
US transabdominal only: enlarged heterogeneous anteverted uterus with multiple large fibroids,largest fibroids (#1) pedunculated right fibroid 8.6 x 7.3 x 8.7 cm, (#2) posterior right subserosal fibroid 8.1 x 7.2 x 7.4 cm,(#3) anterior subserosal fibroid 8.1 x 6.6 x 7.1 cm,normal ovaries,EEC 3.3 mm with a small amount of fluid within the endometrium,endometrium is distorted by fibroids,no free fluid

## 2021-06-20 NOTE — Progress Notes (Signed)
Follow up appointment for results  Chief Complaint  Patient presents with   Follow-up    Ashley [name] today    Blood pressure (!) 156/90, pulse 84, height '5\' 6"'$  (1.676 m), weight 193 lb (87.5 kg), last menstrual period 06/14/2021.  Ashley [name] PELVIS (TRANSABDOMINAL ONLY)  Result Date: 06/20/2021 Images from the original result were not included.  [facility] [address] [place] [address]                                                                                                                                   GYNECOLOGIC SONOGRAM Ashley [name] is a 45 y.o. [phone] Patient's last menstrual period was 06/05/2021 (approximate). She is here for a pelvic sonogram for enlarged uterus. Uterus                      17.5 x 14.9 x 18.3 cm, Total uterine volume 2499 cc, enlarged heterogeneous anteverted uterus with multiple large fibroids,largest fibroids (#1) pedunculated right fibroid 8.6 x 7.3 x 8.7 cm, (#2) posterior right subserosal fibroid 8.1 x 7.2 x 7.4 cm,(#3) anterior subserosal fibroid 8.1 x 6.6 x 7.1 cm Endometrium          3.3 mm, symmetrical, small amount of fluid within the endometrium,endometrium is distorted by fibroids Right ovary             3.4 x 1.1 x 3.1 cm, wnl Left ovary                2.7 x 1.9 x 2.6 cm, wnl No free fluid Technician Comments: Ashley [name] transabdominal only: enlarged heterogeneous anteverted uterus with multiple large fibroids,largest fibroids (#1) pedunculated right fibroid 8.6 x 7.3 x 8.7 cm, (#2) posterior right subserosal fibroid 8.1 x 7.2 x 7.4 cm,(#3) anterior subserosal fibroid 8.1 x 6.6 x 7.1 cm,normal ovaries,EEC 3.3 mm with a small amount of fluid within the endometrium,endometrium is distorted by fibroids,no free fluid [employer] 06/20/2021 10:22 AM Clinical Impression and recommendations: I have reviewed the sonogram results above, combined with the patient's current clinical course, below are my impressions and any appropriate  recommendations for management based on the sonographic findings. Uterus is 2500 cc, consistent with my exam size of 20 weeks size, multiple fibroids Endometrium 3.3 mm, distorted by multiple fibroids Both ovaries are normal Florian Buff 06/20/2021 11:02 AM      MEDS ordered this encounter: No orders of the defined types were placed in this encounter.   Orders for this encounter: Orders Placed This Encounter  Procedures   POCT hemoglobin    Impression:   ICD-10-CM   1. Fibroids, multiple, 20 weeks size  D21.9    on myfembree and iron qod    2. Iron deficiency anemia due to chronic blood loss  D50.0 POCT hemoglobin   Hemoglobin up to 9.3 from 6.3, on myfembree and iron qod  Plan: Per above, follow up in 3 months and tentatively plan on abdominal supracervical if hemoglobin gives Ashley [name] a larger margin of operative safety  Follow Up: Return in about 3 months (around 09/20/2021) for Follow up, with Dr Elonda Husky.      Past Medical History:  Diagnosis Date   Hernia of abdominal cavity    Hypertension     Past Surgical History:  Procedure Laterality Date   CHOLECYSTECTOMY  2011   HERNIA REPAIR     umbilical hernia   TUBAL LIGATION  2009    OB History     Gravida  3   Para  3   Term  3   Preterm      AB      Living  3      SAB      IAB      Ectopic      Multiple      Live Births  3           No Known Allergies  Social History   Socioeconomic History   Marital status: Married    Spouse name: Not on file   Number of children: Not on file   Years of education: Not on file   Highest education level: Not on file  Occupational History   Not on file  Tobacco Use   Smoking status: Never   Smokeless tobacco: Never  Vaping Use   Vaping Use: Never used  Substance and Sexual Activity   Alcohol use: Not Currently   Drug use: Never   Sexual activity: Yes    Birth control/protection: Surgical  Other Topics Concern   Not on file  Social  History Narrative   Married since June 2004.Lives with husband and 3 girls.Works as Estate manager/land agent.   Social Determinants of Health   Financial Resource Strain: Medium Risk   Difficulty of Paying Living Expenses: Somewhat hard  Food Insecurity: No Food Insecurity   Worried About Charity fundraiser in the Last Year: Never true   Ran Out of Food in the Last Year: Never true  Transportation Needs: No Transportation Needs   Lack of Transportation (Medical): No   Lack of Transportation (Non-Medical): No  Physical Activity: Inactive   Days of Exercise per Week: 0 days   Minutes of Exercise per Session: 0 min  Stress: No Stress Concern Present   Feeling of Stress : Not at all  Social Connections: Socially Integrated   Frequency of Communication with Friends and Family: More than three times a week   Frequency of Social Gatherings with Friends and Family: More than three times a week   Attends Religious Services: More than 4 times per year   Active Member of [employer] or Organizations: Yes   Attends Music therapist: More than 4 times per year   Marital Status: Married    Family History  Problem Relation Age of Onset   Cancer Mother    CVA Father    Crohn's disease Father    Hypertension Maternal Grandmother    Diabetes Maternal Grandmother    Dementia Maternal Grandfather    Pneumonia Paternal Merchant navy officer

## 2021-06-29 ENCOUNTER — Ambulatory Visit (INDEPENDENT_AMBULATORY_CARE_PROVIDER_SITE_OTHER): Payer: BC Managed Care – PPO | Admitting: Adult Health

## 2021-06-29 ENCOUNTER — Encounter: Payer: Self-pay | Admitting: Adult Health

## 2021-06-29 ENCOUNTER — Other Ambulatory Visit: Payer: Self-pay

## 2021-06-29 VITALS — BP 165/101 | HR 99 | Ht 66.0 in | Wt 189.8 lb

## 2021-06-29 DIAGNOSIS — D219 Benign neoplasm of connective and other soft tissue, unspecified: Secondary | ICD-10-CM | POA: Diagnosis not present

## 2021-06-29 DIAGNOSIS — D500 Iron deficiency anemia secondary to blood loss (chronic): Secondary | ICD-10-CM

## 2021-06-29 DIAGNOSIS — N920 Excessive and frequent menstruation with regular cycle: Secondary | ICD-10-CM

## 2021-06-29 DIAGNOSIS — N939 Abnormal uterine and vaginal bleeding, unspecified: Secondary | ICD-10-CM

## 2021-06-29 LAB — POCT HEMOGLOBIN: Hemoglobin: 7 g/dL — AB (ref 11–14.6)

## 2021-06-29 MED ORDER — MEGESTROL ACETATE 40 MG PO TABS: ORAL_TABLET | ORAL | 3 refills | Status: DC

## 2021-06-29 NOTE — Progress Notes (Signed)
  Subjective:     Patient ID: Ashley [name], female   DOB: [birth year], 46 y.o.   MRN: [id number]  HPI Ashley [name] is a 45 year old black female, married, G3P3 worked in for heavy bleeding and cramping,feels tired and dizzy at times. Has been on myfembree, for bleeding with fibroids, but bleeding heavy for 3 days.  PCP is Earl Lites NP  Review of Systems Bleeding heavy for 3 days, has cramps Feels tired and dizzy at times Reviewed past medical,surgical, social and family history. Reviewed medications and allergies.     Objective:   Physical Exam BP (!) 165/101 (BP Location: Right Arm, Patient Position: Sitting, Cuff Size: Normal)   Pulse 99   Ht '5\' 6"'$  (1.676 m)   Wt 189 lb 12.8 oz (86.1 kg)   LMP 06/14/2021 (Approximate) Comment: has gotten heavier since 8/3  BMI 30.63 kg/m  HGB 7 by fingerstick. Skin warm and dry.Lungs: clear to ausculation bilaterally. Cardiovascular: regular rate and rhythm.    Pelvic: external genitalia is normal in appearance no lesions, vagina: +blood, dark and heavy,urethra has no lesions or masses noted, cervix:smooth and bulbous, uterus: enlarged, about 20 weeks, non tender, adnexa: no masses or tenderness noted. Bladder is non tender and no masses felt. Examination chaperoned by Celene Squibb LPN  Assessment:     1. Iron deficiency anemia due to chronic blood loss On po iron Check CBC and iron panel -eat iron rich food   2. Menorrhagia with regular cycle Will stop myfrembree  3. Abnormal uterine bleeding (AUB) Will restart megace Meds ordered this encounter  Medications   megestrol (MEGACE) 40 MG tablet    Sig: 3 tablets a day for 5 days, 2 tablets a day for 5 days then 1 tablet daily    Dispense:  45 tablet    Refill:  3    Order Specific Question:   Supervising Provider    Answer:   Elonda Husky, LUTHER H [[id number]]     4. Fibroids, multiple, 20 weeks size     Plan:    Use tylenol and heating pad for cramping Follow up in 5 days with ne

## 2021-06-30 ENCOUNTER — Telehealth: Payer: Self-pay | Admitting: Adult Health

## 2021-06-30 LAB — CBC
Hematocrit: 36.3 % (ref 34.0–46.6)
Hemoglobin: 11.4 g/dL (ref 11.1–15.9)
MCH: 25.9 pg — ABNORMAL LOW (ref 26.6–33.0)
MCHC: 31.4 g/dL — ABNORMAL LOW (ref 31.5–35.7)
MCV: 83 fL (ref 79–97)
Platelets: 335 10*3/uL (ref 150–450)
RBC: 4.4 x10E6/uL (ref 3.77–5.28)
RDW: 17.2 % — ABNORMAL HIGH (ref 11.7–15.4)
WBC: 10 10*3/uL (ref 3.4–10.8)

## 2021-06-30 LAB — IRON,TIBC AND FERRITIN PANEL
Ferritin: 73 ng/mL (ref 15–150)
Iron Saturation: 7 % — CL (ref 15–55)
Iron: 20 ug/dL — ABNORMAL LOW (ref 27–159)
Total Iron Binding Capacity: 307 ug/dL (ref 250–450)
UIBC: 287 ug/dL (ref 131–425)

## 2021-06-30 NOTE — Telephone Encounter (Signed)
Smt. says bleeding still but not as bad, had cramping last night, and she is aware of labs, take iron daily, will follow Tuesday as scheduled.

## 2021-07-04 ENCOUNTER — Ambulatory Visit (INDEPENDENT_AMBULATORY_CARE_PROVIDER_SITE_OTHER): Payer: BC Managed Care – PPO | Admitting: Adult Health

## 2021-07-04 ENCOUNTER — Encounter: Payer: Self-pay | Admitting: Adult Health

## 2021-07-04 ENCOUNTER — Other Ambulatory Visit: Payer: Self-pay

## 2021-07-04 VITALS — BP 140/84 | HR 74 | Ht 66.0 in | Wt 191.0 lb

## 2021-07-04 DIAGNOSIS — D219 Benign neoplasm of connective and other soft tissue, unspecified: Secondary | ICD-10-CM

## 2021-07-04 DIAGNOSIS — N939 Abnormal uterine and vaginal bleeding, unspecified: Secondary | ICD-10-CM | POA: Diagnosis not present

## 2021-07-04 NOTE — Progress Notes (Signed)
  Subjective:     Patient ID: JORDYNE GENSCH, female   DOB: 04-18-1976, 45 y.o.   MRN: [id number]  HPI Emmaclaire is a 45 year old black female, married, G3P3 back in follow up on stopping Myfembree and taking Megace for AUB and bleeding has slowed, still cramping, and feels tired. HGB 11.4 06/29/21,Ferritin 73, iron 20. PCP is Jeralyn Ruths NP  Review of Systems Bleeding as slowed Still has cramps Reviewed past medical,surgical, social and family history. Reviewed medications and allergies.     Objective:   Physical Exam BP 140/84 (BP Location: Right Arm, Patient Position: Sitting, Cuff Size: Normal)   Pulse 74   Ht '5\' 6"'$  (1.676 m)   Wt 191 lb (86.6 kg)   LMP 06/14/2021 (Approximate) Comment: has gotten heavier since 8/3  BMI 30.83 kg/m  Skin warm and dry. Lungs: clear to ausculation bilaterally. Cardiovascular: regular rate and rhythm.    Discussed with Dr Elonda Husky will resume Myfembree and continue megace for 1 month. Assessment:     1. Abnormal uterine bleeding (AUB) Resume Myfembree and continue megace for a momth Check CBC and Iron panel next week  2. Fibroids, multiple, 20 weeks size     Plan:     Follow up with Dr Elonda Husky in 4 weeks

## 2021-07-14 DIAGNOSIS — N939 Abnormal uterine and vaginal bleeding, unspecified: Secondary | ICD-10-CM | POA: Diagnosis not present

## 2021-07-15 LAB — IRON,TIBC AND FERRITIN PANEL
Ferritin: 48 ng/mL (ref 15–150)
Iron Saturation: 21 % (ref 15–55)
Iron: 57 ug/dL (ref 27–159)
Total Iron Binding Capacity: 272 ug/dL (ref 250–450)
UIBC: 215 ug/dL (ref 131–425)

## 2021-07-15 LAB — CBC
Hematocrit: 30.5 % — ABNORMAL LOW (ref 34.0–46.6)
Hemoglobin: 9.5 g/dL — ABNORMAL LOW (ref 11.1–15.9)
MCH: 25.8 pg — ABNORMAL LOW (ref 26.6–33.0)
MCHC: 31.1 g/dL — ABNORMAL LOW (ref 31.5–35.7)
MCV: 83 fL (ref 79–97)
Platelets: 418 10*3/uL (ref 150–450)
RBC: 3.68 x10E6/uL — ABNORMAL LOW (ref 3.77–5.28)
RDW: 15.8 % — ABNORMAL HIGH (ref 11.7–15.4)
WBC: 8.5 10*3/uL (ref 3.4–10.8)

## 2021-07-18 ENCOUNTER — Telehealth: Payer: Self-pay | Admitting: Adult Health

## 2021-07-18 NOTE — Telephone Encounter (Signed)
Left message that hemoglobin has dropped some and that iron and ferritin good, I hope the bleeding has stopped. Keep appt 9/20 with Dr Elonda Husky, I will be in office tomorrow if you want to call me back

## 2021-07-26 ENCOUNTER — Other Ambulatory Visit: Payer: Self-pay | Admitting: Adult Health

## 2021-07-26 MED ORDER — MEGESTROL ACETATE 40 MG PO TABS: ORAL_TABLET | ORAL | 0 refills | Status: DC

## 2021-07-26 NOTE — Progress Notes (Signed)
Refilled megace till appt

## 2021-08-01 ENCOUNTER — Other Ambulatory Visit: Payer: Self-pay

## 2021-08-01 ENCOUNTER — Ambulatory Visit (INDEPENDENT_AMBULATORY_CARE_PROVIDER_SITE_OTHER): Payer: BC Managed Care – PPO | Admitting: Obstetrics & Gynecology

## 2021-08-01 ENCOUNTER — Encounter: Payer: Self-pay | Admitting: Obstetrics & Gynecology

## 2021-08-01 VITALS — BP 152/88 | HR 53 | Ht 66.0 in | Wt 198.0 lb

## 2021-08-01 DIAGNOSIS — D219 Benign neoplasm of connective and other soft tissue, unspecified: Secondary | ICD-10-CM

## 2021-08-01 DIAGNOSIS — D500 Iron deficiency anemia secondary to blood loss (chronic): Secondary | ICD-10-CM

## 2021-08-01 DIAGNOSIS — N939 Abnormal uterine and vaginal bleeding, unspecified: Secondary | ICD-10-CM | POA: Diagnosis not present

## 2021-08-01 LAB — POCT HEMOGLOBIN: Hemoglobin: 8.3 g/dL — AB (ref 11–14.6)

## 2021-08-01 NOTE — Progress Notes (Signed)
Follow up appointment to assess interval status:  Chief Complaint  Patient presents with   Follow-up    Bleeding is slowing down    Blood pressure (!) 152/88, pulse (!) 53, height 5\' 6"  (1.676 m), weight 198 lb (89.8 kg), last menstrual period 06/30/2021.  20 week size fibroids on myfembree + megestrol with inadequate bleeding control, hemoglobin today 8.3 Bleeding is less  Will plan to proceed with abdominal supracervical hysterectomy 10/26, Dr Arnoldo Morale is notified as well  MEDS ordered this encounter: No orders of the defined types were placed in this encounter.   Orders for this encounter: No orders of the defined types were placed in this encounter.   Impression:   ICD-10-CM   1. Fibroids, multiple, 20 weeks size  D21.9     2. Iron deficiency anemia due to chronic blood loss  D50.0     3. Abnormal uterine bleeding (AUB)  N93.9        Plan: Abdominal supracervical hysterectomy BS 09/06/21  Follow Up: Post op visit     All questions were answered.  Past Medical History:  Diagnosis Date   Hernia of abdominal cavity    Hypertension     Past Surgical History:  Procedure Laterality Date   CHOLECYSTECTOMY  2011   HERNIA REPAIR     umbilical hernia   TUBAL LIGATION  2009    OB History     Gravida  3   Para  3   Term  3   Preterm      AB      Living  3      SAB      IAB      Ectopic      Multiple      Live Births  3           No Known Allergies  Social History   Socioeconomic History   Marital status: Married    Spouse name: Not on file   Number of children: Not on file   Years of education: Not on file   Highest education level: Not on file  Occupational History   Not on file  Tobacco Use   Smoking status: Never   Smokeless tobacco: Never  Vaping Use   Vaping Use: Never used  Substance and Sexual Activity   Alcohol use: Not Currently   Drug use: Never   Sexual activity: Yes    Birth control/protection: Surgical     Comment: tubal  Other Topics Concern   Not on file  Social History Narrative   Married since June 2004.Lives with husband and 3 girls.Works as Estate manager/land agent.   Social Determinants of Health   Financial Resource Strain: Medium Risk   Difficulty of Paying Living Expenses: Somewhat hard  Food Insecurity: No Food Insecurity   Worried About Charity fundraiser in the Last Year: Never true   Ran Out of Food in the Last Year: Never true  Transportation Needs: No Transportation Needs   Lack of Transportation (Medical): No   Lack of Transportation (Non-Medical): No  Physical Activity: Inactive   Days of Exercise per Week: 0 days   Minutes of Exercise per Session: 0 min  Stress: No Stress Concern Present   Feeling of Stress : Not at all  Social Connections: Socially Integrated   Frequency of Communication with Friends and Family: More than three times a week   Frequency of Social Gatherings with Friends and Family: More than three times a week  Attends Religious Services: More than 4 times per year   Active Member of Clubs or Organizations: Yes   Attends Music therapist: More than 4 times per year   Marital Status: Married    Family History  Problem Relation Age of Onset   Cancer Mother    CVA Father    Crohn's disease Father    Hypertension Maternal Grandmother    Diabetes Maternal Grandmother    Dementia Maternal Grandfather    Pneumonia Paternal Merchant navy officer

## 2021-08-01 NOTE — Addendum Note (Signed)
Addended by: Levy Pupa S on: 08/01/2021 12:00 PM   Modules accepted: Orders

## 2021-08-09 ENCOUNTER — Other Ambulatory Visit: Payer: Self-pay | Admitting: Adult Health

## 2021-09-04 ENCOUNTER — Other Ambulatory Visit: Payer: Self-pay | Admitting: Obstetrics & Gynecology

## 2021-09-04 ENCOUNTER — Other Ambulatory Visit (HOSPITAL_COMMUNITY)
Admission: RE | Admit: 2021-09-04 | Discharge: 2021-09-04 | Disposition: A | Discharge status: Home / Self Care | Payer: BC Managed Care – PPO | Source: Ambulatory Visit | Attending: Obstetrics & Gynecology | Admitting: Obstetrics & Gynecology

## 2021-09-04 ENCOUNTER — Encounter (HOSPITAL_COMMUNITY)
Admission: RE | Admit: 2021-09-04 | Discharge: 2021-09-04 | Disposition: A | Discharge status: Home / Self Care | Payer: BC Managed Care – PPO | Source: Ambulatory Visit | Attending: Obstetrics & Gynecology | Admitting: Obstetrics & Gynecology

## 2021-09-04 VITALS — BP 147/95 | HR 81 | Temp 98.6°F | Resp 16 | Ht 67.0 in | Wt 279.0 lb

## 2021-09-04 DIAGNOSIS — Z01818 Encounter for other preprocedural examination: Secondary | ICD-10-CM | POA: Diagnosis not present

## 2021-09-04 DIAGNOSIS — Z20822 Contact with and (suspected) exposure to covid-19: Secondary | ICD-10-CM | POA: Diagnosis not present

## 2021-09-04 LAB — SARS CORONAVIRUS 2 (TAT 6-24 HRS): SARS Coronavirus 2: NEGATIVE

## 2021-09-04 LAB — COMPREHENSIVE METABOLIC PANEL
ALT: 7 U/L (ref 0–44)
AST: 20 U/L (ref 15–41)
Albumin: 4 g/dL (ref 3.5–5.0)
Alkaline Phosphatase: 51 U/L (ref 38–126)
Anion gap: 7 (ref 5–15)
BUN: 13 mg/dL (ref 6–20)
CO2: 25 mmol/L (ref 22–32)
Calcium: 9 mg/dL (ref 8.9–10.3)
Chloride: 104 mmol/L (ref 98–111)
Creatinine, Ser: 1.12 mg/dL — ABNORMAL HIGH (ref 0.44–1.00)
GFR, Estimated: >60 mL/min (ref >60)
Glucose, Bld: 93 mg/dL (ref 70–99)
Potassium: 2.8 mmol/L — ABNORMAL LOW (ref 3.5–5.1)
Sodium: 136 mmol/L (ref 135–145)
Total Bilirubin: 1 mg/dL (ref 0.3–1.2)
Total Protein: 8.1 g/dL (ref 6.5–8.1)

## 2021-09-04 LAB — URINALYSIS, ROUTINE W REFLEX MICROSCOPIC
Bilirubin Urine: NEGATIVE
Glucose, UA: NEGATIVE mg/dL
Ketones, ur: NEGATIVE mg/dL
Nitrite: NEGATIVE
Protein, ur: NEGATIVE mg/dL
Specific Gravity, Urine: 1.017 (ref 1.005–1.030)
pH: 6 (ref 5.0–8.0)

## 2021-09-04 LAB — CBC
HCT: 30.7 % — ABNORMAL LOW (ref 36.0–46.0)
Hemoglobin: 9.6 g/dL — ABNORMAL LOW (ref 12.0–15.0)
MCH: 28.4 pg (ref 26.0–34.0)
MCHC: 31.3 g/dL (ref 30.0–36.0)
MCV: 90.8 fL (ref 80.0–100.0)
Platelets: 314 10*3/uL (ref 150–400)
RBC: 3.38 MIL/uL — ABNORMAL LOW (ref 3.87–5.11)
RDW: 14.3 % (ref 11.5–15.5)
WBC: 6.3 10*3/uL (ref 4.0–10.5)
nRBC: 0 % (ref 0.0–0.2)

## 2021-09-04 LAB — TYPE AND SCREEN
ABO/RH(D): A POS
Antibody Screen: NEGATIVE

## 2021-09-04 LAB — RAPID HIV SCREEN (HIV 1/2 AB+AG)
HIV 1/2 Antibodies: NONREACTIVE
HIV-1 P24 Antigen - HIV24: NONREACTIVE

## 2021-09-04 LAB — HCG, QUANTITATIVE, PREGNANCY: hCG, Beta Chain, Quant, S: <1 m[IU]/mL (ref <5)

## 2021-09-04 NOTE — Patient Instructions (Signed)
Your procedure is scheduled on: 09/06/2021  Report to [facility] at  7:00   AM.  Call this number if you have problems the morning of surgery: [phone]   Remember:   Do not Eat or Drink after midnight except for drinking Carb loading drink at 4:30am        No Smoking the morning of surgery  :  Take these medicines the morning of surgery with A SIP OF WATER: none   Do not wear jewelry, make-up or nail polish.  Do not wear lotions, powders, or perfumes. You may wear deodorant.  Do not shave 48 hours prior to surgery. Men may shave face and neck.  Do not bring valuables to the hospital.  Contacts, dentures or bridgework may not be worn into surgery.  Leave suitcase in the car. After surgery it may be brought to your room.  For patients admitted to the hospital, checkout time is 11:00 AM the day of discharge.   Patients discharged the day of surgery will not be allowed to drive home.    Special Instructions: Shower using CHG night before surgery and shower the day of surgery use CHG.  Use special wash - you have one bottle of CHG for all showers.  You should use approximately 1/2 of the bottle for each shower.  How to Use Chlorhexidine for Bathing Chlorhexidine gluconate (CHG) is a germ-killing (antiseptic) solution that is used to clean the skin. It can get rid of the bacteria that normally live on the skin and can keep them away for about 24 hours. To clean your skin with CHG, you may be given: A CHG solution to use in the shower or as part of a sponge bath. A prepackaged cloth that contains CHG. Cleaning your skin with CHG may help lower the risk for infection: While you are staying in the intensive care unit of the hospital. If you have a vascular access, such as a central line, to provide short-term or long-term access to your veins. If you have a catheter to drain urine from your bladder. If you are on a ventilator. A ventilator is a machine that helps you breathe  by moving air in and out of your lungs. After surgery. What are the risks? Risks of using CHG include: A skin reaction. Hearing loss, if CHG gets in your ears and you have a perforated eardrum. Eye injury, if CHG gets in your eyes and is not rinsed out. The CHG product catching fire. Make sure that you avoid smoking and flames after applying CHG to your skin. Do not use CHG: If you have a chlorhexidine allergy or have previously reacted to chlorhexidine. On babies younger than 35 months of age. How to use CHG solution Use CHG only as told by your health care provider, and follow the instructions on the label. Use the full amount of CHG as directed. Usually, this is one bottle. During a shower Follow these steps when using CHG solution during a shower (unless your health care provider gives you different instructions): Start the shower. Use your normal soap and shampoo to wash your face and hair. Turn off the shower or move out of the shower stream. Pour the CHG onto a clean washcloth. Do not use any type of brush or rough-edged sponge. Starting at your neck, lather your body down to your toes. Make sure you follow these instructions: If you will be having surgery, pay special attention to the part of your body  where you will be having surgery. Scrub this area for at least 1 minute. Do not use CHG on your head or face. If the solution gets into your ears or eyes, rinse them well with water. Avoid your genital area. Avoid any areas of skin that have broken skin, cuts, or scrapes. Scrub your back and under your arms. Make sure to wash skin folds. Let the lather sit on your skin for 1-2 minutes or as long as told by your health care provider. Thoroughly rinse your entire body in the shower. Make sure that all body creases and crevices are rinsed well. Dry off with a clean towel. Do not put any substances on your body afterward--such as powder, lotion, or perfume--unless you are told to do so  by your health care provider. Only use lotions that are recommended by the manufacturer. Put on clean clothes or pajamas. If it is the night before your surgery, sleep in clean sheets.  During a sponge bath Follow these steps when using CHG solution during a sponge bath (unless your health care provider gives you different instructions): Use your normal soap and shampoo to wash your face and hair. Pour the CHG onto a clean washcloth. Starting at your neck, lather your body down to your toes. Make sure you follow these instructions: If you will be having surgery, pay special attention to the part of your body where you will be having surgery. Scrub this area for at least 1 minute. Do not use CHG on your head or face. If the solution gets into your ears or eyes, rinse them well with water. Avoid your genital area. Avoid any areas of skin that have broken skin, cuts, or scrapes. Scrub your back and under your arms. Make sure to wash skin folds. Let the lather sit on your skin for 1-2 minutes or as long as told by your health care provider. Using a different clean, wet washcloth, thoroughly rinse your entire body. Make sure that all body creases and crevices are rinsed well. Dry off with a clean towel. Do not put any substances on your body afterward--such as powder, lotion, or perfume--unless you are told to do so by your health care provider. Only use lotions that are recommended by the manufacturer. Put on clean clothes or pajamas. If it is the night before your surgery, sleep in clean sheets. How to use CHG prepackaged cloths Only use CHG cloths as told by your health care provider, and follow the instructions on the label. Use the CHG cloth on clean, dry skin. Do not use the CHG cloth on your head or face unless your health care provider tells you to. When washing with the CHG cloth: Avoid your genital area. Avoid any areas of skin that have broken skin, cuts, or scrapes. Before  surgery Follow these steps when using a CHG cloth to clean before surgery (unless your health care provider gives you different instructions): Using the CHG cloth, vigorously scrub the part of your body where you will be having surgery. Scrub using a back-and-forth motion for 3 minutes. The area on your body should be completely wet with CHG when you are done scrubbing. Do not rinse. Discard the cloth and let the area air-dry. Do not put any substances on the area afterward, such as powder, lotion, or perfume. Put on clean clothes or pajamas. If it is the night before your surgery, sleep in clean sheets.  For general bathing Follow these steps when using CHG cloths for  general bathing (unless your health care provider gives you different instructions). Use a separate CHG cloth for each area of your body. Make sure you wash between any folds of skin and between your fingers and toes. Wash your body in the following order, switching to a new cloth after each step: The front of your neck, shoulders, and chest. Both of your arms, under your arms, and your hands. Your stomach and groin area, avoiding the genitals. Your right leg and foot. Your left leg and foot. The back of your neck, your back, and your buttocks. Do not rinse. Discard the cloth and let the area air-dry. Do not put any substances on your body afterward--such as powder, lotion, or perfume--unless you are told to do so by your health care provider. Only use lotions that are recommended by the manufacturer. Put on clean clothes or pajamas. Contact a health care provider if: Your skin gets irritated after scrubbing. You have questions about using your solution or cloth. You swallow any chlorhexidine. Call your local poison control center (1-260-251-3706 in the [place] Get help right away if: Your eyes itch badly, or they become very red or swollen. Your skin itches badly and is red or swollen. Your hearing changes. You have trouble  seeing. You have swelling or tingling in your mouth or throat. You have trouble breathing. These symptoms may represent a serious problem that is an emergency. Do not wait to see if the symptoms will go away. Get medical help right away. Call your local emergency services (911 in the [place] Do not drive yourself to the hospital. Summary Chlorhexidine gluconate (CHG) is a germ-killing (antiseptic) solution that is used to clean the skin. Cleaning your skin with CHG may help to lower your risk for infection. You may be given CHG to use for bathing. It may be in a bottle or in a prepackaged cloth to use on your skin. Carefully follow your health care provider's instructions and the instructions on the product label. Do not use CHG if you have a chlorhexidine allergy. Contact your health care provider if your skin gets irritated after scrubbing. This information is not intended to replace advice given to you by your health care provider. Make sure you discuss any questions you have with your health care provider. Document Revised: 01/09/2021 Document Reviewed: 01/09/2021 Elsevier Patient Education  2022 [employer]. Abdominal Hysterectomy, Care After The following information offers guidance on how to care for yourself after your procedure. Your doctor may also give you more specific instructions. If you have problems or questions, contact your doctor. What can I expect after the procedure? After the procedure, it is common to have: Pain. Tiredness. No desire to eat. Less interest in sex. Bleeding and fluid (discharge) from your vagina. You may need to use a pad after this procedure. Trouble pooping (constipation). Feelings of sadness or other emotions. Follow these instructions at home: Medicines Take over-the-counter and prescription medicines only as told by your doctor. If you were prescribed an antibiotic medicine, take it as told by your doctor. Do not stop using the antibiotic even if  you start to feel better. If told, take steps to prevent problems with pooping (constipation). You may need to: Drink enough fluid to keep your pee (urine) pale yellow. Take medicines. You will be told what medicines to take. Eat foods that are high in fiber. These include beans, whole grains, and fresh fruits and vegetables. Limit foods that are high in fat and sugar. These include  fried or sweet foods. Ask your doctor if you should avoid driving or using machines while you are taking your medicine. Surgical cut care  Follow instructions from your doctor about how to take care of your cut from surgery (incision). Make sure you: Wash your hands with soap and water for at least 20 seconds before and after you change your bandage. If you cannot use soap and water, use hand sanitizer. Change your bandage. Leave stitches or skin glue in place for at least two weeks. Leave tape strips alone unless you are told to take them off. You may trim the edges of the tape strips if they curl up. Keep the bandage dry until your doctor says it can be taken off. Check your incision every day for signs of infection. Check for: More redness, swelling, or pain. Fluid or blood. Warmth. Pus or a bad smell. Activity  Rest as told by your doctor. Get up to take short walks every 1 to 2 hours. Ask for help if you feel weak or unsteady. Do not lift anything that is heavier than 10 lb (4.5 kg), or the limit that you are told. Follow your doctor's advice about exercise, driving, and general activities. Return to your normal activities when your doctor says that it is safe. Lifestyle Do not douche, use tampons, or have sex for at least 6 weeks or as told by your doctor. Do not drink alcohol until your doctor says it is okay. Do not smoke or use any products that contain nicotine or tobacco. These can delay healing after surgery. If you need help quitting, ask your doctor. General instructions Do not take baths,  swim, or use a hot tub. Ask your doctor about taking showers or sponge baths. Try to have a responsible adult at home with you for the first 1-2 weeks to help with your daily chores. Wear tight-fitting (compression) stockings as told by your doctor. Keep all follow-up visits. Contact a doctor if: You have chills or a fever. You have any of these signs of infection around your cut: More redness, swelling, or pain. Fluid or blood. Warmth. Pus or a bad smell. Your cut breaks open. You feel dizzy or light-headed. You have pain or bleeding when you pee. You keep having watery poop (diarrhea). You keep feeling like you may vomit or you keep vomiting. You have fluid coming from your vagina that is not normal. You have any type of reaction to your medicine that is not normal, like a rash, or you develop an allergy to your medicine. Your pain medicine does not help. Get help right away if: You have a fever and your symptoms get worse suddenly. You have very bad pain in your belly (abdomen). You are short of breath. You faint. You have pain, swelling, or redness of your leg. You bleed a lot from your vagina and you see blood clots. Summary It is normal to have some pain, tiredness, and fluid that comes from your vagina. Do not take baths, swim, or use a hot tub. Ask your doctor about taking showers or sponge baths. Do not lift anything that is heavier than 10 lb (4.5 kg), or the limit that you are told. Follow your doctor's advice about exercise, driving, and general activities. Try to have a responsible adult at home with you for the first 1-2 weeks to help with your daily chores. This information is not intended to replace advice given to you by your health care provider. Make sure you  discuss any questions you have with your health care provider. Document Revised: 01/06/2021 Document Reviewed: 06/30/2020 Elsevier Patient Education  [place] Anesthesia, Adult, Care  After This sheet gives you information about how to care for yourself after your procedure. Your health care provider may also give you more specific instructions. If you have problems or questions, contact your health care provider. What can I expect after the procedure? After the procedure, the following side effects are common: Pain or discomfort at the IV site. Nausea. Vomiting. Sore throat. Trouble concentrating. Feeling cold or chills. Feeling weak or tired. Sleepiness and fatigue. Soreness and body aches. These side effects can affect parts of the body that were not involved in surgery. Follow these instructions at home: For the time period you were told by your health care provider:  Rest. Do not participate in activities where you could fall or become injured. Do not drive or use machinery. Do not drink alcohol. Do not take sleeping pills or medicines that cause drowsiness. Do not make important decisions or sign legal documents. Do not take care of children on your own. Eating and drinking Follow any instructions from your health care provider about eating or drinking restrictions. When you feel hungry, start by eating small amounts of foods that are soft and easy to digest (bland), such as toast. Gradually return to your regular diet. Drink enough fluid to keep your urine pale yellow. If you vomit, rehydrate by drinking water, juice, or clear broth. General instructions If you have sleep apnea, surgery and certain medicines can increase your risk for breathing problems. Follow instructions from your health care provider about wearing your sleep device: Anytime you are sleeping, including during daytime naps. While taking prescription pain medicines, sleeping medicines, or medicines that make you drowsy. Have a responsible adult stay with you for the time you are told. It is important to have someone help care for you until you are awake and alert. Return to your normal  activities as told by your health care provider. Ask your health care provider what activities are safe for you. Take over-the-counter and prescription medicines only as told by your health care provider. If you smoke, do not smoke without supervision. Keep all follow-up visits as told by your health care provider. This is important. Contact a health care provider if: You have nausea or vomiting that does not get better with medicine. You cannot eat or drink without vomiting. You have pain that does not get better with medicine. You are unable to pass urine. You develop a skin rash. You have a fever. You have redness around your IV site that gets worse. Get help right away if: You have difficulty breathing. You have chest pain. You have blood in your urine or stool, or you vomit blood. Summary After the procedure, it is common to have a sore throat or nausea. It is also common to feel tired. Have a responsible adult stay with you for the time you are told. It is important to have someone help care for you until you are awake and alert. When you feel hungry, start by eating small amounts of foods that are soft and easy to digest (bland), such as toast. Gradually return to your regular diet. Drink enough fluid to keep your urine pale yellow. Return to your normal activities as told by your health care provider. Ask your health care provider what activities are safe for you. This information is not intended to replace advice given  to you by your health care provider. Make sure you discuss any questions you have with your health care provider. Document Revised: 07/14/2020 Document Reviewed: 02/11/2020 Elsevier Patient Education  2022 [employer].

## 2021-09-05 ENCOUNTER — Other Ambulatory Visit: Payer: Self-pay | Admitting: Obstetrics & Gynecology

## 2021-09-05 MED ORDER — POTASSIUM CHLORIDE CRYS ER 20 MEQ PO TBCR: 40.0000 meq | EXTENDED_RELEASE_TABLET | Freq: Every day | ORAL | 2 refills | Status: DC

## 2021-09-06 ENCOUNTER — Ambulatory Visit (HOSPITAL_COMMUNITY): Payer: BC Managed Care – PPO | Admitting: Anesthesiology

## 2021-09-06 ENCOUNTER — Ambulatory Visit (HOSPITAL_COMMUNITY)
Admission: RE | Admit: 2021-09-06 | Discharge: 2021-09-06 | Disposition: A | Discharge status: Home / Self Care | Payer: BC Managed Care – PPO | Attending: Obstetrics & Gynecology | Admitting: Obstetrics & Gynecology

## 2021-09-06 ENCOUNTER — Encounter (HOSPITAL_COMMUNITY)
Admission: RE | Disposition: A | Discharge status: Home / Self Care | Payer: Self-pay | Source: Home / Self Care | Attending: Obstetrics & Gynecology

## 2021-09-06 ENCOUNTER — Encounter (HOSPITAL_COMMUNITY): Payer: Self-pay | Admitting: Obstetrics & Gynecology

## 2021-09-06 ENCOUNTER — Other Ambulatory Visit: Payer: Self-pay

## 2021-09-06 DIAGNOSIS — N8003 Adenomyosis of the uterus: Secondary | ICD-10-CM | POA: Diagnosis not present

## 2021-09-06 DIAGNOSIS — N946 Dysmenorrhea, unspecified: Secondary | ICD-10-CM | POA: Diagnosis not present

## 2021-09-06 DIAGNOSIS — N921 Excessive and frequent menstruation with irregular cycle: Secondary | ICD-10-CM | POA: Diagnosis not present

## 2021-09-06 DIAGNOSIS — N840 Polyp of corpus uteri: Secondary | ICD-10-CM | POA: Insufficient documentation

## 2021-09-06 DIAGNOSIS — D259 Leiomyoma of uterus, unspecified: Secondary | ICD-10-CM | POA: Diagnosis not present

## 2021-09-06 DIAGNOSIS — D219 Benign neoplasm of connective and other soft tissue, unspecified: Secondary | ICD-10-CM

## 2021-09-06 DIAGNOSIS — D500 Iron deficiency anemia secondary to blood loss (chronic): Secondary | ICD-10-CM

## 2021-09-06 HISTORY — PX: SUPRACERVICAL ABDOMINAL HYSTERECTOMY: SHX5393

## 2021-09-06 HISTORY — PX: SALPINGOOPHORECTOMY: SHX82

## 2021-09-06 HISTORY — PX: UNILATERAL SALPINGECTOMY: SHX6160

## 2021-09-06 HISTORY — PX: CERVICAL CONIZATION W/BX: SHX1330

## 2021-09-06 LAB — POCT I-STAT, CHEM 8
BUN: 10 mg/dL (ref 6–20)
Calcium, Ion: 1.22 mmol/L (ref 1.15–1.40)
Chloride: 105 mmol/L (ref 98–111)
Creatinine, Ser: 1.1 mg/dL — ABNORMAL HIGH (ref 0.44–1.00)
Glucose, Bld: 100 mg/dL — ABNORMAL HIGH (ref 70–99)
HCT: 32 % — ABNORMAL LOW (ref 36.0–46.0)
Hemoglobin: 10.9 g/dL — ABNORMAL LOW (ref 12.0–15.0)
Potassium: 3.5 mmol/L (ref 3.5–5.1)
Sodium: 142 mmol/L (ref 135–145)
TCO2: 26 mmol/L (ref 22–32)

## 2021-09-06 LAB — POCT HEMOGLOBIN-HEMACUE: Hemoglobin: 9.4 g/dL — ABNORMAL LOW (ref 12.0–15.0)

## 2021-09-06 SURGERY — HYSTERECTOMY, SUPRACERVICAL, ABDOMINAL
Anesthesia: General | Site: Abdomen | Laterality: Right

## 2021-09-06 MED ORDER — HYDROMORPHONE HCL 1 MG/ML IJ SOLN
0.5000 mg | Freq: Once | INTRAMUSCULAR | Status: AC | PRN
  Administered 2021-09-06 (×2): 0.5 mg via INTRAVENOUS
  Filled 2021-09-06: qty 1

## 2021-09-06 MED ORDER — LACTATED RINGERS IV SOLN: INTRAVENOUS | Status: DC

## 2021-09-06 MED ORDER — GABAPENTIN 300 MG PO CAPS: 300.0000 mg | ORAL_CAPSULE | Freq: Three times a day (TID) | ORAL | 0 refills | Status: DC

## 2021-09-06 MED ORDER — FENTANYL CITRATE (PF) 250 MCG/5ML IJ SOLN
INTRAMUSCULAR | Status: AC
  Filled 2021-09-06: qty 5

## 2021-09-06 MED ORDER — KETOROLAC TROMETHAMINE 10 MG PO TABS: 10.0000 mg | ORAL_TABLET | Freq: Three times a day (TID) | ORAL | 0 refills | Status: DC | PRN

## 2021-09-06 MED ORDER — 0.9 % SODIUM CHLORIDE (POUR BTL) OPTIME
TOPICAL | Status: DC | PRN
  Administered 2021-09-06 (×2): 1000 mL

## 2021-09-06 MED ORDER — HEMOSTATIC AGENTS (NO CHARGE) OPTIME
TOPICAL | Status: DC | PRN
  Administered 2021-09-06: 1 via TOPICAL

## 2021-09-06 MED ORDER — CEFAZOLIN IN SODIUM CHLORIDE 3-0.9 GM/100ML-% IV SOLN
3.0000 g | INTRAVENOUS | Status: AC
  Administered 2021-09-06: 3 g via INTRAVENOUS
  Filled 2021-09-06: qty 100

## 2021-09-06 MED ORDER — MIDAZOLAM HCL 2 MG/2ML IJ SOLN
INTRAMUSCULAR | Status: AC
  Filled 2021-09-06: qty 2

## 2021-09-06 MED ORDER — ONDANSETRON 8 MG PO TBDP: 8.0000 mg | ORAL_TABLET | Freq: Three times a day (TID) | ORAL | 0 refills | Status: DC | PRN

## 2021-09-06 MED ORDER — DEXAMETHASONE SODIUM PHOSPHATE 10 MG/ML IJ SOLN
INTRAMUSCULAR | Status: DC | PRN
  Administered 2021-09-06: 10 mg via INTRAVENOUS

## 2021-09-06 MED ORDER — ONDANSETRON HCL 4 MG/2ML IJ SOLN
INTRAMUSCULAR | Status: DC | PRN
  Administered 2021-09-06: 4 mg via INTRAVENOUS

## 2021-09-06 MED ORDER — POVIDONE-IODINE 10 % EX SWAB: 2.0000 "application " | Freq: Once | CUTANEOUS | Status: DC

## 2021-09-06 MED ORDER — KETOROLAC TROMETHAMINE 30 MG/ML IJ SOLN
30.0000 mg | Freq: Once | INTRAMUSCULAR | Status: AC
Start: 2021-09-06 — End: 2021-09-06
  Administered 2021-09-06: 30 mg via INTRAVENOUS

## 2021-09-06 MED ORDER — ROCURONIUM 10MG/ML (10ML) SYRINGE FOR MEDFUSION PUMP - OPTIME
INTRAVENOUS | Status: DC | PRN
  Administered 2021-09-06: 50 mg via INTRAVENOUS
  Administered 2021-09-06: 10 mg via INTRAVENOUS

## 2021-09-06 MED ORDER — HYDRALAZINE HCL 20 MG/ML IJ SOLN
INTRAMUSCULAR | Status: DC | PRN
  Administered 2021-09-06: 10 mg via INTRAVENOUS

## 2021-09-06 MED ORDER — PROPOFOL 10 MG/ML IV BOLUS
INTRAVENOUS | Status: DC | PRN
  Administered 2021-09-06: 200 mg via INTRAVENOUS

## 2021-09-06 MED ORDER — ORAL CARE MOUTH RINSE: 15.0000 mL | Freq: Once | OROMUCOSAL | Status: AC

## 2021-09-06 MED ORDER — ROCURONIUM BROMIDE 10 MG/ML (PF) SYRINGE
PREFILLED_SYRINGE | INTRAVENOUS | Status: AC
  Filled 2021-09-06: qty 10

## 2021-09-06 MED ORDER — FENTANYL CITRATE PF 50 MCG/ML IJ SOSY: 25.0000 ug | PREFILLED_SYRINGE | INTRAMUSCULAR | Status: DC | PRN

## 2021-09-06 MED ORDER — PROPOFOL 10 MG/ML IV BOLUS
INTRAVENOUS | Status: AC
  Filled 2021-09-06: qty 40

## 2021-09-06 MED ORDER — FENTANYL CITRATE (PF) 100 MCG/2ML IJ SOLN
INTRAMUSCULAR | Status: DC | PRN
  Administered 2021-09-06 (×7): 50 ug via INTRAVENOUS

## 2021-09-06 MED ORDER — CHLORHEXIDINE GLUCONATE 0.12 % MT SOLN
15.0000 mL | Freq: Once | OROMUCOSAL | Status: AC
  Administered 2021-09-06: 15 mL via OROMUCOSAL

## 2021-09-06 MED ORDER — LIDOCAINE HCL (CARDIAC) PF 50 MG/5ML IV SOSY
PREFILLED_SYRINGE | INTRAVENOUS | Status: DC | PRN
  Administered 2021-09-06: 80 mg via INTRAVENOUS

## 2021-09-06 MED ORDER — ONDANSETRON HCL 4 MG/2ML IJ SOLN
4.0000 mg | Freq: Once | INTRAMUSCULAR | Status: AC | PRN
  Administered 2021-09-06: 4 mg via INTRAVENOUS
  Filled 2021-09-06: qty 2

## 2021-09-06 MED ORDER — METOPROLOL TARTRATE 5 MG/5ML IV SOLN
INTRAVENOUS | Status: DC | PRN
  Administered 2021-09-06: 2.5 mg via INTRAVENOUS

## 2021-09-06 MED ORDER — ONDANSETRON HCL 4 MG/2ML IJ SOLN
INTRAMUSCULAR | Status: AC
  Filled 2021-09-06: qty 2

## 2021-09-06 MED ORDER — FENTANYL CITRATE (PF) 100 MCG/2ML IJ SOLN
INTRAMUSCULAR | Status: AC
  Filled 2021-09-06: qty 2

## 2021-09-06 MED ORDER — MEPERIDINE HCL 50 MG/ML IJ SOLN: 6.2500 mg | INTRAMUSCULAR | Status: DC | PRN

## 2021-09-06 MED ORDER — DEXAMETHASONE SODIUM PHOSPHATE 10 MG/ML IJ SOLN
INTRAMUSCULAR | Status: AC
  Filled 2021-09-06: qty 1

## 2021-09-06 MED ORDER — KETOROLAC TROMETHAMINE 30 MG/ML IJ SOLN
INTRAMUSCULAR | Status: AC
  Filled 2021-09-06: qty 1

## 2021-09-06 MED ORDER — BUPIVACAINE-MELOXICAM ER 200-6 MG/7ML IJ SOLN: 300.0000 mg | Freq: Once | INTRAMUSCULAR | Status: DC

## 2021-09-06 MED ORDER — SCOPOLAMINE 1 MG/3DAYS TD PT72
MEDICATED_PATCH | TRANSDERMAL | Status: AC
  Administered 2021-09-06: 1.5 mg via TRANSDERMAL
  Filled 2021-09-06: qty 1

## 2021-09-06 MED ORDER — BUPIVACAINE-MELOXICAM ER 200-6 MG/7ML IJ SOLN
INTRAMUSCULAR | Status: DC | PRN
  Administered 2021-09-06: 400 mg

## 2021-09-06 MED ORDER — OXYCODONE-ACETAMINOPHEN 7.5-325 MG PO TABS
1.0000 | ORAL_TABLET | ORAL | Status: DC | PRN
  Filled 2021-09-06: qty 2

## 2021-09-06 MED ORDER — OXYCODONE-ACETAMINOPHEN 7.5-325 MG PO TABS: 1.0000 | ORAL_TABLET | ORAL | 0 refills | Status: DC | PRN

## 2021-09-06 MED ORDER — SCOPOLAMINE 1 MG/3DAYS TD PT72: 1.0000 | MEDICATED_PATCH | Freq: Once | TRANSDERMAL | Status: DC

## 2021-09-06 MED ORDER — OXYCODONE HCL 5 MG PO TABS
10.0000 mg | ORAL_TABLET | Freq: Once | ORAL | Status: AC
  Administered 2021-09-06: 10 mg via ORAL
  Filled 2021-09-06: qty 2

## 2021-09-06 MED ORDER — ACETAMINOPHEN 10 MG/ML IV SOLN
1000.0000 mg | Freq: Once | INTRAVENOUS | Status: AC
  Administered 2021-09-06: 1000 mg via INTRAVENOUS
  Filled 2021-09-06: qty 100

## 2021-09-06 MED ORDER — METOCLOPRAMIDE HCL 5 MG/ML IJ SOLN
10.0000 mg | Freq: Once | INTRAMUSCULAR | Status: AC
  Administered 2021-09-06: 10 mg via INTRAVENOUS
  Filled 2021-09-06: qty 2

## 2021-09-06 MED ORDER — PROMETHAZINE HCL 25 MG PO TABS: 25.0000 mg | ORAL_TABLET | Freq: Four times a day (QID) | ORAL | 1 refills | Status: DC | PRN

## 2021-09-06 MED ORDER — BUPIVACAINE-MELOXICAM ER 200-6 MG/7ML IJ SOLN
INTRAMUSCULAR | Status: AC
  Filled 2021-09-06: qty 2

## 2021-09-06 MED ORDER — MIDAZOLAM HCL 5 MG/5ML IJ SOLN
INTRAMUSCULAR | Status: DC | PRN
  Administered 2021-09-06: 2 mg via INTRAVENOUS

## 2021-09-06 SURGICAL SUPPLY — 42 items
APPLICATOR COTTON TIP 6 STRL (MISCELLANEOUS) ×8 IMPLANT
APPLICATOR COTTON TIP 6IN STRL (MISCELLANEOUS) ×10
BAG HAMPER (MISCELLANEOUS) ×5 IMPLANT
BLADE SURG SZ10 CARB STEEL (BLADE) ×5 IMPLANT
CLOTH BEACON ORANGE TIMEOUT ST (SAFETY) ×5 IMPLANT
COVER LIGHT HANDLE STERIS (MISCELLANEOUS) ×10 IMPLANT
DERMABOND ADVANCED (GAUZE/BANDAGES/DRESSINGS) ×1
DERMABOND ADVANCED .7 DNX12 (GAUZE/BANDAGES/DRESSINGS) ×4 IMPLANT
DRAPE WARM FLUID 44X44 (DRAPES) ×5 IMPLANT
DRSG OPSITE POSTOP 4X10 (GAUZE/BANDAGES/DRESSINGS) ×5 IMPLANT
ELECT REM PT RETURN 9FT ADLT (ELECTROSURGICAL) ×5
ELECTRODE REM PT RTRN 9FT ADLT (ELECTROSURGICAL) ×4 IMPLANT
GAUZE 4X4 16PLY ~~LOC~~+RFID DBL (SPONGE) ×10 IMPLANT
GLOVE ECLIPSE 8.0 STRL XLNG CF (GLOVE) ×5 IMPLANT
GLOVE SRG 8 PF TXTR STRL LF DI (GLOVE) ×4 IMPLANT
GLOVE SURG UNDER POLY LF SZ7 (GLOVE) ×20 IMPLANT
GLOVE SURG UNDER POLY LF SZ8 (GLOVE) ×5
GOWN STRL REUS W/TWL LRG LVL3 (GOWN DISPOSABLE) ×10 IMPLANT
GOWN STRL REUS W/TWL XL LVL3 (GOWN DISPOSABLE) ×5 IMPLANT
HEMOSTAT ARISTA ABSORB 3G PWDR (HEMOSTASIS) ×5 IMPLANT
INST SET MAJOR GENERAL (KITS) ×5 IMPLANT
KIT BLADEGUARD II DBL (SET/KITS/TRAYS/PACK) ×5 IMPLANT
KIT TURNOVER KIT A (KITS) ×5 IMPLANT
MANIFOLD NEPTUNE II (INSTRUMENTS) ×5 IMPLANT
NEEDLE HYPO 18GX1.5 BLUNT FILL (NEEDLE) IMPLANT
NEEDLE HYPO 21X1.5 SAFETY (NEEDLE) IMPLANT
NS IRRIG 1000ML POUR BTL (IV SOLUTION) ×10 IMPLANT
PACK MAJOR ABDOMINAL (CUSTOM PROCEDURE TRAY) ×5 IMPLANT
PAD ARMBOARD 7.5X6 YLW CONV (MISCELLANEOUS) ×5 IMPLANT
SET BASIN LINEN APH (SET/KITS/TRAYS/PACK) ×5 IMPLANT
SPONGE T-LAP 18X18 ~~LOC~~+RFID (SPONGE) ×10 IMPLANT
SUT CHROMIC 0 CT 1 (SUTURE) ×5 IMPLANT
SUT MON AB 3-0 SH 27 (SUTURE) ×10 IMPLANT
SUT VIC AB 0 CT1 27 (SUTURE) ×15
SUT VIC AB 0 CT1 27XBRD ANTBC (SUTURE) IMPLANT
SUT VIC AB 0 CT1 27XCR 8 STRN (SUTURE) ×12 IMPLANT
SUT VIC AB 0 CTX 36 (SUTURE) ×5
SUT VIC AB 0 CTX36XBRD ANTBCTR (SUTURE) ×4 IMPLANT
SUT VICRYL 3 0 (SUTURE) ×5 IMPLANT
SYR 20ML LL LF (SYRINGE) IMPLANT
TOWEL ~~LOC~~+RFID 17X26 BLUE (SPONGE) ×5 IMPLANT
TRAY FOLEY MTR SLVR 16FR STAT (SET/KITS/TRAYS/PACK) ×5 IMPLANT

## 2021-09-06 NOTE — Discharge Instructions (Addendum)
Post Operative Pain Med Plan:  >Take gabapentin 300 mg three times per day, as prescribed for 4 days, try to space them evenly  >Take the oxycodone 1 tablet, on a schedule, around the clock, every 6 hours(set your phone alarm) for the first 2 days, there may     Be times when you will need 2 tablets but taking them on a schedule will decrease this need  >Then take the oxycodone on an as needed basis per the prescription instructions  >Take the ketorolac or toradol every 8 hours for the first 3 days then the remainder to supplement the oxycodone  >After the toradol is gone then use  ibuprofen as ordered as needed to help supplement the oxycodone  >Use a heating pad as well as needed  >Of course the zofran is available to take as prescribed when needed for nausea  >Be gentle with your diet the first few days, liquids and soft non spicy food, fruits are great  >Get up and move, no lifting or straining  Dr Elonda Husky

## 2021-09-06 NOTE — Anesthesia Postprocedure Evaluation (Signed)
Anesthesia Post Note  Patient: Ashley [name]  Procedure(s) Performed: HYSTERECTOMY SUPRACERVICAL ABDOMINAL (Abdomen) OPEN RIGHT SALPINGO OOPHORECTOMY (Right: Abdomen) LEFT SALPINGECTOMY (Left: Abdomen) ENDOCERVICAL CONIZATION (Abdomen)  Patient location during evaluation: Phase II Anesthesia Type: General Level of consciousness: awake and alert and oriented Pain management: pain level controlled Vital Signs Assessment: post-procedure vital signs reviewed and stable Respiratory status: spontaneous breathing, nonlabored ventilation and respiratory function stable Cardiovascular status: blood pressure returned to baseline and stable Postop Assessment: no apparent nausea or vomiting Anesthetic complications: no   No notable events documented.   Last Vitals:  Vitals:   09/06/21 1300 09/06/21 1315  BP: (!) 150/93 (!) 152/99  Pulse: 72 (!) 57  Resp: 18 16  Temp:  36.7 C  SpO2: 98% 99%    Last Pain:  Vitals:   09/06/21 1315  TempSrc: Oral  PainSc:                  [name]

## 2021-09-06 NOTE — Transfer of Care (Signed)
Immediate Anesthesia Transfer of Care Note  Patient: Ashley [name]  Procedure(s) Performed: HYSTERECTOMY SUPRACERVICAL ABDOMINAL (Abdomen) OPEN RIGHT SALPINGO OOPHORECTOMY (Right: Abdomen) LEFT SALPINGECTOMY (Left: Abdomen) ENDOCERVICAL CONIZATION (Abdomen)  Patient Location: PACU  Anesthesia Type:General  Level of Consciousness: awake  Airway & Oxygen Therapy: Patient Spontanous Breathing  Post-op Assessment: Report given to RN  Post vital signs: Reviewed and stable  Last Vitals:  Vitals Value Taken Time  BP 181/104 09/06/21 1045  Temp    Pulse 57 09/06/21 1046  Resp 22 09/06/21 1046  SpO2 97 % 09/06/21 1046  Vitals shown include unvalidated device data.  Last Pain:  Vitals:   09/06/21 4473  TempSrc: Oral  PainSc: 0-No pain      Patients Stated Pain Goal: 9 ([id number])  Complications: No notable events documented.

## 2021-09-06 NOTE — Op Note (Signed)
Preoperative diagnosis:  1.  20 week size fibroid uterus                                          2.  menometrorrhagia                                         3.  dysmenorrhea                                         4.  Anemia due to chronic menometrorrhagia  Postoperative diagnosis:  Same as above   Procedure:  Abdominal hysterectomy, supracervical, RSO and left salpingectomy  Surgeon:  Florian Buff  Assistant:    Anesthesia:  General endotracheal    Intraoperative findings: 20 weeks, multiple fibroids, right ovary blood supply engulfed   Description of operation:  Patient was taken to the operating room and placed in the supine position where she underwent general endotracheal anesthesia.  She was then prepped and draped in the usual sterile fashion and a Foley catheter was placed for continuous bladder drainage.  A 8 cm low transverse skin incision was made and carried down sharply to the rectus fascia which was scored in the midline and extended laterally.  The fascia was taken off the muscles superiorly and inferiorly without difficulty.  The muscles were divided.  The peritoneal cavity was entered.   The upper abdomen was packed away. Both uterine cornu were grasped with Coker clamps.  The left round ligament was suture ligated and coagulated with the electrocautery unit.  The left vesicouterine serosal flap was created.  An avascular window in in the peritoneum was created and the utero-ovarian ligament was cross clamped, cut and suture ligated.  The right infundibulo pelvic ligament was clamped, cut and double suture ligated and cut.  The vesicouterine serosal flap on the right was created.  An avascular window in the peritoneum was created and the right utero-ovarian ligament was cross clamped, cut and double suture ligated.  Thus both ovaries were preserved.  The uterine vessels were skeletonized bilaterally.  The uterine vessels were clamped bilaterally,  then cut and suture ligated.   Two more pedicles were taken down the cervix medial to the uterine vessels.  Each pedicle was clamped cut and suture ligated with good resulting hemostasis.  As per the preoperative plan the cervix was then transected sharply and the specimen was removed.  The cervical stump was then closed anterior to posterior for hemostasis and reduce postoperative adhesions.  The pelvis was irrigated vigorously and all pedicles were examined and found to be hemostatic.  Both Fallpoian tubes were removed by cross clamping with a Kelley clamp and a fore and aft 3-0 monocryl suture for hemostasis.  All specimens were sent to pathology for routine evaluation.   the pelvis was irrigated vigorously.  All packs were removed and all counts were correct at this point x 3. Zynrelef was placed  The muscles and peritoneum were reapproximated loosely.  The fascia was closed with 0 Vicryl running.   Zynrelef was placed  The skin was closed using 3-0 Vicryl on a Keith needle in a subcuticular fashion.  Dermabond was then applied for additional  wound integrity and to serve as a postoperative bacterial barrier.  The patient was awakened from anesthesia taken to the recovery room in good stable condition. All sponge instrument and needle counts were correct x 3.  The patient received Ancef and Toradol prophylactically preoperatively.  Estimated blood loss for the procedure was 500  cc.  Florian Buff, MD  09/06/2021 10:34 AM

## 2021-09-06 NOTE — Anesthesia Preprocedure Evaluation (Signed)
Anesthesia Evaluation  Patient identified by MRN, date of birth, ID band Patient awake    Reviewed: Allergy & Precautions, NPO status , Patient's Chart, lab work & pertinent test results  History of Anesthesia Complications Negative for: history of anesthetic complications  Airway Mallampati: II  TM Distance: >3 FB Neck ROM: Full    Dental  (+) Dental Advisory Given, Missing, Caps   Pulmonary neg pulmonary ROS,    Pulmonary exam normal breath sounds clear to auscultation       Cardiovascular Exercise Tolerance: Good hypertension, Pt. on medications Normal cardiovascular exam Rhythm:Regular Rate:Normal     Neuro/Psych negative neurological ROS  negative psych ROS   GI/Hepatic negative GI ROS, Neg liver ROS,   Endo/Other  negative endocrine ROS  Renal/GU negative Renal ROS  negative genitourinary   Musculoskeletal negative musculoskeletal ROS (+)   Abdominal   Peds negative pediatric ROS (+)  Hematology negative hematology ROS (+)   Anesthesia Other Findings   Reproductive/Obstetrics negative OB ROS                            Anesthesia Physical Anesthesia Plan  ASA: 2  Anesthesia Plan: General   Post-op Pain Management:    Induction: Intravenous  PONV Risk Score and Plan: 4 or greater and Ondansetron, Dexamethasone and Midazolam  Airway Management Planned: Oral ETT  Additional Equipment:   Intra-op Plan:   Post-operative Plan: Extubation in OR  Informed Consent: I have reviewed the patients History and Physical, chart, labs and discussed the procedure including the risks, benefits and alternatives for the proposed anesthesia with the patient or authorized representative who has indicated his/her understanding and acceptance.     Dental advisory given  Plan Discussed with: CRNA and Surgeon  Anesthesia Plan Comments:         Anesthesia Quick Evaluation

## 2021-09-06 NOTE — H&P (Signed)
Preoperative History and Physical  Ashley [name] is a 45 y.o. [id number] with Patient's last menstrual period was 08/14/2021. admitted for a supracervical abdominal hysterectomy with removal of tubes.   20 week size fibroids on myfembree + megestrol with inadequate bleeding control, hemoglobin today 8.3 Bleeding is less  Now Hgb up to 9.3  Dr Arnoldo Morale and I discussed hernia repair, he wants to do at another date post hysterectomy  PMH:    Past Medical History:  Diagnosis Date   Hernia of abdominal cavity    Hypertension     PSH:     Past Surgical History:  Procedure Laterality Date   CHOLECYSTECTOMY  2011   HERNIA REPAIR     umbilical hernia   TUBAL LIGATION  2009    POb/GynH:      OB History     Gravida  3   Para  3   Term  3   Preterm      AB      Living  3      SAB      IAB      Ectopic      Multiple      Live Births  3           SH:   Social History   Tobacco Use   Smoking status: Never   Smokeless tobacco: Never  Vaping Use   Vaping Use: Never used  Substance Use Topics   Alcohol use: Not Currently   Drug use: Never    FH:    Family History  Problem Relation Age of Onset   Cancer Mother    CVA Father    Crohn's disease Father    Hypertension Maternal Grandmother    Diabetes Maternal Grandmother    Dementia Maternal Grandfather    Pneumonia Paternal Grandfather      Allergies: No Known Allergies  Medications:       Current Facility-Administered Medications:    bupivacaine-meloxicam ER (ZYNRELEF) injection 300 mg, 300 mg, Infiltration, Once, Lilu Mcglown, Mertie Clause, MD   ceFAZolin (ANCEF) IVPB 3g/100 mL premix, 3 g, Intravenous, On Call to OR, Florian Buff, MD   ketorolac (TORADOL) 30 MG/ML injection, , , ,    lactated ringers infusion, , Intravenous, Continuous, Battula, Rajamani C, MD, Last Rate: 50 mL/hr at 09/06/21 0716, New Bag at 09/06/21 0716   povidone-iodine 10 % swab 2 application, 2 application, Topical, Once,  Florian Buff, MD  Review of Systems:   Review of Systems  Constitutional: Negative for fever, chills, weight loss, malaise/fatigue and diaphoresis.  HENT: Negative for hearing loss, ear pain, nosebleeds, congestion, sore throat, neck pain, tinnitus and ear discharge.   Eyes: Negative for blurred vision, double vision, photophobia, pain, discharge and redness.  Respiratory: Negative for cough, hemoptysis, sputum production, shortness of breath, wheezing and stridor.   Cardiovascular: Negative for chest pain, palpitations, orthopnea, claudication, leg swelling and PND.  Gastrointestinal: Positive for abdominal pain. Negative for heartburn, nausea, vomiting, diarrhea, constipation, blood in stool and melena.  Genitourinary: Negative for dysuria, urgency, frequency, hematuria and flank pain.  Musculoskeletal: Negative for myalgias, back pain, joint pain and falls.  Skin: Negative for itching and rash.  Neurological: Negative for dizziness, tingling, tremors, sensory change, speech change, focal weakness, seizures, loss of consciousness, weakness and headaches.  Endo/Heme/Allergies: Negative for environmental allergies and polydipsia. Does not bruise/bleed easily.  Psychiatric/Behavioral: Negative for depression, suicidal ideas, hallucinations, memory loss and substance abuse. The patient is not  nervous/anxious and does not have insomnia.      PHYSICAL EXAM:  Blood pressure (!) 168/92, pulse 90, temperature 98.1 F (36.7 C), temperature source Oral, resp. rate 16, height 5\' 7"  (1.702 m), weight 81.6 kg, last menstrual period 08/14/2021, SpO2 100 %.    Vitals reviewed. Constitutional: She is oriented to person, place, and time. She appears well-developed and well-nourished.  HENT:  Head: Normocephalic and atraumatic.  Right Ear: External ear normal.  Left Ear: External ear normal.  Nose: Nose normal.  Mouth/Throat: Oropharynx is clear and moist.  Eyes: Conjunctivae and EOM are normal.  Pupils are equal, round, and reactive to light. Right eye exhibits no discharge. Left eye exhibits no discharge. No scleral icterus.  Neck: Normal range of motion. Neck supple. No tracheal deviation present. No thyromegaly present.  Cardiovascular: Normal rate, regular rhythm, normal heart sounds and intact distal pulses.  Exam reveals no gallop and no friction rub.   No murmur heard. Respiratory: Effort normal and breath sounds normal. No respiratory distress. She has no wheezes. She has no rales. She exhibits no tenderness.  GI: Soft. Bowel sounds are normal. She exhibits no distension and no mass. There is tenderness. There is no rebound and no guarding.  Genitourinary:       Vulva is normal without lesions Vagina is pink moist without discharge Cervix normal in appearance and pap is normal Uterus is 20 weeks size Adnexa is negative with normal sized ovaries by sonogram  Musculoskeletal: Normal range of motion. She exhibits no edema and no tenderness.  Neurological: She is alert and oriented to person, place, and time. She has normal reflexes. She displays normal reflexes. No cranial nerve deficit. She exhibits normal muscle tone. Coordination normal.  Skin: Skin is warm and dry. No rash noted. No erythema. No pallor.  Psychiatric: She has a normal mood and affect. Her behavior is normal. Judgment and thought content normal.    Labs: Results for orders placed or performed during the hospital encounter of 09/06/21 (from the past 336 hour(s))  I-STAT, chem 8   Collection Time: 09/06/21  7:19 AM  Result Value Ref Range   Sodium 142 135 - 145 mmol/L   Potassium 3.5 3.5 - 5.1 mmol/L   Chloride 105 98 - 111 mmol/L   BUN 10 6 - 20 mg/dL   Creatinine, Ser 1.10 (H) 0.44 - 1.00 mg/dL   Glucose, Bld 100 (H) 70 - 99 mg/dL   Calcium, Ion 1.22 1.15 - 1.40 mmol/L   TCO2 26 22 - 32 mmol/L   Hemoglobin 10.9 (L) 12.0 - 15.0 g/dL   HCT 32.0 (L) 36.0 - 46.0 %  Results for orders placed or performed  during the hospital encounter of 09/04/21 (from the past 336 hour(s))  SARS CORONAVIRUS 2 (TAT 6-24 HRS) Nasopharyngeal Nasopharyngeal Swab   Collection Time: 09/04/21  1:47 PM   Specimen: Nasopharyngeal Swab  Result Value Ref Range   SARS Coronavirus 2 NEGATIVE NEGATIVE  Urinalysis, Routine w reflex microscopic Urine, Clean Catch   Collection Time: 09/04/21  1:58 PM  Result Value Ref Range   Color, Urine YELLOW YELLOW   APPearance HAZY (A) CLEAR   Specific Gravity, Urine 1.017 1.005 - 1.030   pH 6.0 5.0 - 8.0   Glucose, UA NEGATIVE NEGATIVE mg/dL   Hgb urine dipstick LARGE (A) NEGATIVE   Bilirubin Urine NEGATIVE NEGATIVE   Ketones, ur NEGATIVE NEGATIVE mg/dL   Protein, ur NEGATIVE NEGATIVE mg/dL   Nitrite NEGATIVE NEGATIVE  Leukocytes,Ua LARGE (A) NEGATIVE   RBC / HPF 11-20 0 - 5 RBC/hpf   WBC, UA 11-20 0 - 5 WBC/hpf   Bacteria, UA RARE (A) NONE SEEN   Squamous Epithelial / LPF 6-10 0 - 5   Mucus PRESENT   CBC   Collection Time: 09/04/21  2:01 PM  Result Value Ref Range   WBC 6.3 4.0 - 10.5 K/uL   RBC 3.38 (L) 3.87 - 5.11 MIL/uL   Hemoglobin 9.6 (L) 12.0 - 15.0 g/dL   HCT 30.7 (L) 36.0 - 46.0 %   MCV 90.8 80.0 - 100.0 fL   MCH 28.4 26.0 - 34.0 pg   MCHC 31.3 30.0 - 36.0 g/dL   RDW 14.3 11.5 - 15.5 %   Platelets 314 150 - 400 K/uL   nRBC 0.0 0.0 - 0.2 %  Comprehensive metabolic panel   Collection Time: 09/04/21  2:01 PM  Result Value Ref Range   Sodium 136 135 - 145 mmol/L   Potassium 2.8 (L) 3.5 - 5.1 mmol/L   Chloride 104 98 - 111 mmol/L   CO2 25 22 - 32 mmol/L   Glucose, Bld 93 70 - 99 mg/dL   BUN 13 6 - 20 mg/dL   Creatinine, Ser 1.12 (H) 0.44 - 1.00 mg/dL   Calcium 9.0 8.9 - 10.3 mg/dL   Total Protein 8.1 6.5 - 8.1 g/dL   Albumin 4.0 3.5 - 5.0 g/dL   AST 20 15 - 41 U/L   ALT 7 0 - 44 U/L   Alkaline Phosphatase 51 38 - 126 U/L   Total Bilirubin 1.0 0.3 - 1.2 mg/dL   GFR, Estimated >60 >60 mL/min   Anion gap 7 5 - 15  hCG, quantitative, pregnancy    Collection Time: 09/04/21  2:01 PM  Result Value Ref Range   hCG, Beta Chain, Quant, S <1 <5 mIU/mL  Rapid HIV screen (HIV 1/2 Ab+Ag)   Collection Time: 09/04/21  2:01 PM  Result Value Ref Range   HIV-1 P24 Antigen - HIV24 NON REACTIVE NON REACTIVE   HIV 1/2 Antibodies NON REACTIVE NON REACTIVE   Interpretation (HIV Ag Ab)      A non reactive test result means that HIV 1 or HIV 2 antibodies and HIV 1 p24 antigen were not detected in the specimen.  Type and screen   Collection Time: 09/04/21  2:01 PM  Result Value Ref Range   ABO/RH(D) A POS    Antibody Screen NEG    Sample Expiration      09/18/2021,2359 Performed at [facility], [address] [place], [place] [address]     EKG: Orders placed or performed during the hospital encounter of 09/04/21   EKG 12-Lead   EKG 12-Lead    Imaging Studies: No results found.    Assessment: 20 week size fibroid uterus on myfembree and megestrol for interim control Menometrorrhagia/AUB Dysmenorrhea Anemia due to menometrorrhagia  Plan: Abdominal supracervical hysterectomy with BS, preserve ovaries  Pt understands the risks of surgery including but not limited t  excessive bleeding requiring transfusion or reoperation, post-operative infection requiring prolonged hospitalization or re-hospitalization and antibiotic therapy, and damage to other organs including bladder, bowel, ureters and major vessels.  The patient also understands the alternative treatment options which were discussed in full.  All questions were answered.  Mertie Clause Takeia Ciaravino 09/06/2021 8:06 AM    Florian Buff 09/06/2021 8:03 AM

## 2021-09-07 ENCOUNTER — Encounter (HOSPITAL_COMMUNITY): Payer: Self-pay | Admitting: Obstetrics & Gynecology

## 2021-09-07 LAB — SURGICAL PATHOLOGY

## 2021-09-09 ENCOUNTER — Other Ambulatory Visit (INDEPENDENT_AMBULATORY_CARE_PROVIDER_SITE_OTHER): Payer: Self-pay | Admitting: Nurse Practitioner

## 2021-09-09 DIAGNOSIS — I10 Essential (primary) hypertension: Secondary | ICD-10-CM

## 2021-09-11 MED ORDER — LOSARTAN POTASSIUM-HCTZ 100-12.5 MG PO TABS: 1.0000 | ORAL_TABLET | Freq: Every day | ORAL | 0 refills | Status: DC

## 2021-09-12 ENCOUNTER — Other Ambulatory Visit: Payer: Self-pay

## 2021-09-13 ENCOUNTER — Other Ambulatory Visit: Payer: Self-pay

## 2021-09-14 ENCOUNTER — Other Ambulatory Visit: Payer: Self-pay

## 2021-09-14 ENCOUNTER — Other Ambulatory Visit (INDEPENDENT_AMBULATORY_CARE_PROVIDER_SITE_OTHER): Payer: Self-pay

## 2021-09-15 ENCOUNTER — Ambulatory Visit (INDEPENDENT_AMBULATORY_CARE_PROVIDER_SITE_OTHER): Payer: BC Managed Care – PPO | Admitting: Obstetrics & Gynecology

## 2021-09-15 ENCOUNTER — Encounter: Payer: Self-pay | Admitting: Obstetrics & Gynecology

## 2021-09-15 ENCOUNTER — Other Ambulatory Visit: Payer: Self-pay

## 2021-09-15 VITALS — BP 134/84 | HR 80 | Ht 66.0 in | Wt 197.0 lb

## 2021-09-15 DIAGNOSIS — Z9889 Other specified postprocedural states: Secondary | ICD-10-CM

## 2021-09-15 MED ORDER — OXYCODONE-ACETAMINOPHEN 7.5-325 MG PO TABS: 1.0000 | ORAL_TABLET | ORAL | 0 refills | Status: DC | PRN

## 2021-09-15 MED ORDER — KETOROLAC TROMETHAMINE 10 MG PO TABS: 10.0000 mg | ORAL_TABLET | Freq: Three times a day (TID) | ORAL | 0 refills | Status: DC | PRN

## 2021-09-15 NOTE — Progress Notes (Signed)
  HPI: Patient returns for routine postoperative follow-up having undergone abdominal supracervical hysterectomy with RSO left salpingectomyon 09/06/21.  The patient's immediate postoperative recovery has been unremarkable. Since hospital discharge the patient reports usual post op issues .   Current Outpatient Medications: losartan-hydrochlorothiazide (HYZAAR) 100-12.5 MG tablet, Take 1 tablet by mouth daily., Disp: 90 tablet, Rfl: 0 potassium chloride SA (KLOR-CON) 20 MEQ tablet, Take 2 tablets (40 mEq total) by mouth daily., Disp: 30 tablet, Rfl: 2 promethazine (PHENERGAN) 25 MG tablet, Take 1 tablet (25 mg total) by mouth every 6 (six) hours as needed for nausea., Disp: 30 tablet, Rfl: 1 gabapentin (NEURONTIN) 300 MG capsule, Take 1 capsule (300 mg total) by mouth 3 (three) times daily. (Patient not taking: Reported on 09/15/2021), Disp: 12 capsule, Rfl: 0 ketorolac (TORADOL) 10 MG tablet, Take 1 tablet (10 mg total) by mouth every 8 (eight) hours as needed., Disp: 15 tablet, Rfl: 0 ondansetron (ZOFRAN ODT) 8 MG disintegrating tablet, Take 1 tablet (8 mg total) by mouth every 8 (eight) hours as needed for nausea or vomiting. (Patient not taking: Reported on 09/15/2021), Disp: 20 tablet, Rfl: 0 oxyCODONE-acetaminophen (PERCOCET) 7.5-325 MG tablet, Take 1-2 tablets by mouth every 4 (four) hours as needed for moderate pain., Disp: 15 tablet, Rfl: 0  No current facility-administered medications for this visit.    Blood pressure 134/84, pulse 80, height 5\' 6"  (1.676 m), weight 197 lb (89.4 kg), last menstrual period 08/14/2021.  Physical Exam: Incision clean dry intact healing well Abdomen is benign  Diagnostic Tests:   Pathology: benign  Impression:   ICD-10-CM   1. S/P Abdominal supracervical hysterectomy with RSO and left salpingectomy 09/06/21  Z98.890         Plan: Orders Placed This Encounter     oxyCODONE-acetaminophen (PERCOCET) 7.5-325 MG tablet         Sig: Take 1-2  tablets by mouth every 4 (four) hours as needed for moderate pain.         Dispense:  15 tablet         Refill:  0     ketorolac (TORADOL) 10 MG tablet         Sig: Take 1 tablet (10 mg total) by mouth every 8 (eight) hours as needed.         Dispense:  15 tablet         Refill:  0    Follow up: Return in about 5 weeks (around 10/20/2021) for [place], with Dr Elonda Husky.    Florian Buff, MD

## 2021-09-19 ENCOUNTER — Ambulatory Visit: Payer: BC Managed Care – PPO | Admitting: Obstetrics & Gynecology

## 2021-09-26 ENCOUNTER — Other Ambulatory Visit: Payer: Self-pay | Admitting: Obstetrics & Gynecology

## 2021-09-29 ENCOUNTER — Encounter: Payer: Self-pay | Admitting: Obstetrics & Gynecology

## 2021-09-29 ENCOUNTER — Other Ambulatory Visit: Payer: Self-pay | Admitting: Obstetrics & Gynecology

## 2021-09-29 MED ORDER — KETOROLAC TROMETHAMINE 10 MG PO TABS: 10.0000 mg | ORAL_TABLET | Freq: Three times a day (TID) | ORAL | 0 refills | Status: DC | PRN

## 2021-10-03 NOTE — Telephone Encounter (Signed)
No I don't like to give iron post op, GI disturbance/constipation,  it will come up with time.

## 2021-10-08 ENCOUNTER — Other Ambulatory Visit: Payer: Self-pay

## 2021-10-08 ENCOUNTER — Emergency Department (HOSPITAL_COMMUNITY): Payer: BC Managed Care – PPO

## 2021-10-08 ENCOUNTER — Encounter (HOSPITAL_COMMUNITY): Payer: Self-pay

## 2021-10-08 ENCOUNTER — Emergency Department (HOSPITAL_COMMUNITY)
Admission: EM | Admit: 2021-10-08 | Discharge: 2021-10-08 | Disposition: A | Discharge status: Home / Self Care | Payer: BC Managed Care – PPO | Attending: Emergency Medicine | Admitting: Emergency Medicine

## 2021-10-08 DIAGNOSIS — Z79899 Other long term (current) drug therapy: Secondary | ICD-10-CM | POA: Diagnosis not present

## 2021-10-08 DIAGNOSIS — I10 Essential (primary) hypertension: Secondary | ICD-10-CM | POA: Insufficient documentation

## 2021-10-08 DIAGNOSIS — Y836 Removal of other organ (partial) (total) as the cause of abnormal reaction of the patient, or of later complication, without mention of misadventure at the time of the procedure: Secondary | ICD-10-CM | POA: Insufficient documentation

## 2021-10-08 DIAGNOSIS — S301XXA Contusion of abdominal wall, initial encounter: Secondary | ICD-10-CM

## 2021-10-08 DIAGNOSIS — R109 Unspecified abdominal pain: Secondary | ICD-10-CM | POA: Diagnosis present

## 2021-10-08 DIAGNOSIS — L7634 Postprocedural seroma of skin and subcutaneous tissue following other procedure: Secondary | ICD-10-CM | POA: Insufficient documentation

## 2021-10-08 DIAGNOSIS — K429 Umbilical hernia without obstruction or gangrene: Secondary | ICD-10-CM | POA: Insufficient documentation

## 2021-10-08 LAB — CBC WITH DIFFERENTIAL/PLATELET
Abs Immature Granulocytes: 0.01 10*3/uL (ref 0.00–0.07)
Basophils Absolute: 0.1 10*3/uL (ref 0.0–0.1)
Basophils Relative: 1 %
Eosinophils Absolute: 0.1 10*3/uL (ref 0.0–0.5)
Eosinophils Relative: 2 %
HCT: 29.8 % — ABNORMAL LOW (ref 36.0–46.0)
Hemoglobin: 9.2 g/dL — ABNORMAL LOW (ref 12.0–15.0)
Immature Granulocytes: 0 %
Lymphocytes Relative: 27 %
Lymphs Abs: 1.8 10*3/uL (ref 0.7–4.0)
MCH: 25.9 pg — ABNORMAL LOW (ref 26.0–34.0)
MCHC: 30.9 g/dL (ref 30.0–36.0)
MCV: 83.9 fL (ref 80.0–100.0)
Monocytes Absolute: 0.5 10*3/uL (ref 0.1–1.0)
Monocytes Relative: 7 %
Neutro Abs: 4.2 10*3/uL (ref 1.7–7.7)
Neutrophils Relative %: 63 %
Platelets: 271 10*3/uL (ref 150–400)
RBC: 3.55 MIL/uL — ABNORMAL LOW (ref 3.87–5.11)
RDW: 13.9 % (ref 11.5–15.5)
WBC: 6.7 10*3/uL (ref 4.0–10.5)
nRBC: 0 % (ref 0.0–0.2)

## 2021-10-08 LAB — BASIC METABOLIC PANEL
Anion gap: 9 (ref 5–15)
BUN: 12 mg/dL (ref 6–20)
CO2: 25 mmol/L (ref 22–32)
Calcium: 9.1 mg/dL (ref 8.9–10.3)
Chloride: 101 mmol/L (ref 98–111)
Creatinine, Ser: 1.09 mg/dL — ABNORMAL HIGH (ref 0.44–1.00)
GFR, Estimated: >60 mL/min (ref >60)
Glucose, Bld: 109 mg/dL — ABNORMAL HIGH (ref 70–99)
Potassium: 3.4 mmol/L — ABNORMAL LOW (ref 3.5–5.1)
Sodium: 135 mmol/L (ref 135–145)

## 2021-10-08 LAB — LACTIC ACID, PLASMA: Lactic Acid, Venous: 0.9 mmol/L (ref 0.5–1.9)

## 2021-10-08 MED ORDER — HYDROMORPHONE HCL 1 MG/ML IJ SOLN
1.0000 mg | Freq: Once | INTRAMUSCULAR | Status: AC
  Administered 2021-10-08: 04:00:00 1 mg via INTRAVENOUS
  Filled 2021-10-08: qty 1

## 2021-10-08 MED ORDER — DOXYCYCLINE HYCLATE 100 MG PO CAPS: 100.0000 mg | ORAL_CAPSULE | Freq: Two times a day (BID) | ORAL | 0 refills | Status: DC

## 2021-10-08 MED ORDER — IOHEXOL 300 MG/ML  SOLN
100.0000 mL | Freq: Once | INTRAMUSCULAR | Status: AC | PRN
  Administered 2021-10-08: 05:00:00 100 mL via INTRAVENOUS

## 2021-10-08 MED ORDER — DOXYCYCLINE HYCLATE 100 MG PO TABS
100.0000 mg | ORAL_TABLET | Freq: Once | ORAL | Status: AC
  Administered 2021-10-08: 07:00:00 100 mg via ORAL
  Filled 2021-10-08: qty 1

## 2021-10-08 MED ORDER — ONDANSETRON HCL 4 MG/2ML IJ SOLN
4.0000 mg | Freq: Once | INTRAMUSCULAR | Status: AC
  Administered 2021-10-08: 04:00:00 4 mg via INTRAVENOUS
  Filled 2021-10-08: qty 2

## 2021-10-08 NOTE — ED Provider Notes (Signed)
[facility] EMERGENCY DEPARTMENT Provider Note   CSN: [id number] Arrival date & time: 10/08/21  0205     History Chief Complaint  Patient presents with   Abdominal Pain    Ashley [name] is a 45 y.o. female.  Patient presents to the emergency department for evaluation of abdominal pain.  Patient reports that she had an abdominal hysterectomy on October 26.  She had been doing well after the surgery but recently recently has been having pain at the surgical site.  Patient reports that she started having drainage from the left side of the surgical incision 2 days ago.  The drainage has an odor.  She has not had any fever.   Abdominal Pain     Past Medical History:  Diagnosis Date   Hernia of abdominal cavity    Hypertension     Patient Active Problem List   Diagnosis Date Noted   Fibroids    Menometrorrhagia    Dysmenorrhea    Anemia due to chronic blood loss    BLOOD CHEMISTRY, ABNORMAL 01/11/2010    Past Surgical History:  Procedure Laterality Date   CERVICAL CONIZATION W/BX N/A 09/06/2021   Procedure: ENDOCERVICAL CONIZATION;  Surgeon: Florian Buff, MD;  Location: [facility];  Service: Gynecology;  Laterality: N/A;   CHOLECYSTECTOMY  2011   HERNIA REPAIR     umbilical hernia   SALPINGOOPHORECTOMY Right 09/06/2021   Procedure: OPEN RIGHT SALPINGO OOPHORECTOMY;  Surgeon: Florian Buff, MD;  Location: [facility];  Service: Gynecology;  Laterality: Right;   SUPRACERVICAL ABDOMINAL HYSTERECTOMY N/A 09/06/2021   Procedure: HYSTERECTOMY SUPRACERVICAL ABDOMINAL;  Surgeon: Florian Buff, MD;  Location: [facility];  Service: Gynecology;  Laterality: N/A;   TUBAL LIGATION  2009   UNILATERAL SALPINGECTOMY Left 09/06/2021   Procedure: LEFT SALPINGECTOMY;  Surgeon: Florian Buff, MD;  Location: [facility];  Service: Gynecology;  Laterality: Left;     OB History     Gravida  3   Para  3   Term  3   Preterm      AB      Living  3      SAB      IAB      Ectopic       Multiple      Live Births  3           Family History  Problem Relation Age of Onset   Cancer Mother    CVA Father    Crohn's disease Father    Hypertension Maternal Grandmother    Diabetes Maternal Grandmother    Dementia Maternal Grandfather    Pneumonia Paternal Grandfather     Social History   Tobacco Use   Smoking status: Never   Smokeless tobacco: Never  Vaping Use   Vaping Use: Never used  Substance Use Topics   Alcohol use: Not Currently   Drug use: Never    Home Medications Prior to Admission medications   Medication Sig Start Date End Date Taking? Authorizing Provider  gabapentin (NEURONTIN) 300 MG capsule Take 1 capsule (300 mg total) by mouth 3 (three) times daily. Patient not taking: Reported on 09/15/2021 09/06/21   Florian Buff, MD  ketorolac (TORADOL) 10 MG tablet TAKE 1 TABLET BY MOUTH EVERY 8 HOURS AS NEEDED 10/03/21   Florian Buff, MD  ketorolac (TORADOL) 10 MG tablet Take 1 tablet (10 mg total) by mouth every 8 (eight) hours as needed. 09/29/21   Tania Ade  H, MD  losartan-hydrochlorothiazide (HYZAAR) 100-12.5 MG tablet Take 1 tablet by mouth daily. 09/11/21   Ailene Ards, NP  ondansetron (ZOFRAN ODT) 8 MG disintegrating tablet Take 1 tablet (8 mg total) by mouth every 8 (eight) hours as needed for nausea or vomiting. Patient not taking: Reported on 09/15/2021 09/06/21   Florian Buff, MD  oxyCODONE-acetaminophen (PERCOCET) 7.5-325 MG tablet Take 1-2 tablets by mouth every 4 (four) hours as needed for moderate pain. 09/15/21   Florian Buff, MD  potassium chloride SA (KLOR-CON) 20 MEQ tablet Take 2 tablets (40 mEq total) by mouth daily. 09/05/21   Florian Buff, MD  promethazine (PHENERGAN) 25 MG tablet Take 1 tablet (25 mg total) by mouth every 6 (six) hours as needed for nausea. 09/06/21   Florian Buff, MD    Allergies    Patient has no known allergies.  Review of Systems   Review of Systems  Gastrointestinal:  Positive for  abdominal pain.  Skin:  Positive for wound.  All other systems reviewed and are negative.  Physical Exam Updated Vital Signs BP (!) 135/91   Pulse 64   Temp 98.2 F (36.8 C) (Oral)   Resp 16   Ht 5\' 6"  (1.676 m)   Wt 89.4 kg   LMP 08/14/2021   SpO2 97%   BMI 31.81 kg/m   Physical Exam Vitals and nursing note reviewed.  Constitutional:      General: She is not in acute distress.    Appearance: Normal appearance. She is well-developed.  HENT:     Head: Normocephalic and atraumatic.     Right Ear: Hearing normal.     Left Ear: Hearing normal.     Nose: Nose normal.  Eyes:     Conjunctiva/sclera: Conjunctivae normal.     Pupils: Pupils are equal, round, and reactive to light.  Cardiovascular:     Rate and Rhythm: Regular rhythm.     Heart sounds: S1 normal and S2 normal. No murmur heard.   No friction rub. No gallop.  Pulmonary:     Effort: Pulmonary effort is normal. No respiratory distress.     Breath sounds: Normal breath sounds.  Chest:     Chest wall: No tenderness.  Abdominal:     General: Bowel sounds are normal.     Palpations: Abdomen is soft.     Tenderness: There is no abdominal tenderness. There is no guarding or rebound. Negative signs include Murphy's sign and McBurney's sign.     Hernia: No hernia is present.  Musculoskeletal:        General: Normal range of motion.     Cervical back: Normal range of motion and neck supple.  Skin:    General: Skin is warm and dry.     Findings: Wound (4 mm opening at the left edge of the hysterectomy incision that is draining thick, cloudy drainage) present. No rash.  Neurological:     Mental Status: She is alert and oriented to person, place, and time.     GCS: GCS eye subscore is 4. GCS verbal subscore is 5. GCS motor subscore is 6.     Cranial Nerves: No cranial nerve deficit.     Sensory: No sensory deficit.     Coordination: Coordination normal.  Psychiatric:        Speech: Speech normal.        Behavior:  Behavior normal.        Thought Content: Thought content normal.  ED Results / Procedures / Treatments   Labs (all labs ordered are listed, but only abnormal results are displayed) Labs Reviewed  CBC WITH DIFFERENTIAL/PLATELET - Abnormal; Notable for the following components:      Result Value   RBC 3.55 (*)    Hemoglobin 9.2 (*)    HCT 29.8 (*)    MCH 25.9 (*)    All other components within normal limits  BASIC METABOLIC PANEL - Abnormal; Notable for the following components:   Potassium 3.4 (*)    Glucose, Bld 109 (*)    Creatinine, Ser 1.09 (*)    All other components within normal limits  LACTIC ACID, PLASMA    EKG None  Radiology CT ABDOMEN PELVIS W CONTRAST  Result Date: 10/08/2021 CLINICAL DATA:  Abdominal abscess or infection suspected. Left lower abdominal pain at surgical site from recent hysterectomy. EXAM: CT ABDOMEN AND PELVIS WITH CONTRAST TECHNIQUE: Multidetector CT imaging of the abdomen and pelvis was performed using the standard protocol following bolus administration of intravenous contrast. CONTRAST:  130mL OMNIPAQUE IOHEXOL 300 MG/ML  SOLN COMPARISON:  01/04/2010 FINDINGS: Lower chest:  No contributory findings. Hepatobiliary: No focal liver abnormality.Cholecystectomy. Pancreas: Unremarkable. Spleen: Unremarkable. Adrenals/Urinary Tract: Negative adrenals. No hydronephrosis or stone. Unremarkable bladder. Stomach/Bowel: Diffuse colonic gas and stool with formed stool greatest at the sigmoid. No obstruction. No bowel wall thickening or other visible inflammation. Vascular/Lymphatic: No acute vascular abnormality. No mass or adenopathy. Reproductive:Hysterectomy. Small volume fluid in the pelvis which is expected after recent surgery. Other: Unremarkable ventral abdominal wall incision. Fatty periumbilical hernia and true umbilical hernia. Musculoskeletal: No acute abnormalities.  Mild scoliosis. IMPRESSION: 1. No unexpected finding related to recent hysterectomy.  Negative for abscess. 2. Diffuse stool and gas distended colon, please correlate for constipation. 3. Fatty umbilical and periumbilical hernias. Electronically Signed   By: Jorje Guild M.D.   On: 10/08/2021 05:55    Procedures Procedures   Medications Ordered in ED Medications  ondansetron (ZOFRAN) injection 4 mg (4 mg Intravenous Given 10/08/21 0332)  HYDROmorphone (DILAUDID) injection 1 mg (1 mg Intravenous Given 10/08/21 0332)  iohexol (OMNIPAQUE) 300 MG/ML solution 100 mL (100 mLs Intravenous Contrast Given 10/08/21 0519)    ED Course  I have reviewed the triage vital signs and the nursing notes.  Pertinent labs & imaging results that were available during my care of the patient were reviewed by me and considered in my medical decision making (see chart for details).    MDM Rules/Calculators/A&P                           Patient presents to the emergency department for evaluation of abdominal pain.  Patient had abdominal hysterectomy performed on October 26.  She healed well from the surgery but recently began having pain and in the last 2 days has had drainage from the surgical site.  Examination reveals a very tiny opening at the left side of the scar that is draining a small amount of purulence.  I suspect that this was a seroma that became infected and has spontaneously drained.  Lab work is reassuring.  No fever, no leukocytosis.  Normal lactic acid.  CT scan does not show any fluid collection since.  Will cover with doxycycline, follow-up with OB/GYN as needed.  Final Clinical Impression(s) / ED Diagnoses Final diagnoses:  Abdominal wall seroma, initial encounter    Rx / DC Orders ED Discharge Orders     None  Orpah Greek, MD 10/08/21 [phone]

## 2021-10-08 NOTE — ED Triage Notes (Signed)
Pt arrived via POV c/o lower left abdominal pain from surgical site of recent hysterectomy. Pt reports taking prescribed oxycodone 90 mins PTA without relief. Pt does present with drainage from surgical site and reports it is foul smelling.

## 2021-10-16 ENCOUNTER — Other Ambulatory Visit (INDEPENDENT_AMBULATORY_CARE_PROVIDER_SITE_OTHER): Payer: Self-pay | Admitting: Nurse Practitioner

## 2021-10-16 DIAGNOSIS — I10 Essential (primary) hypertension: Secondary | ICD-10-CM

## 2021-10-17 MED ORDER — LOSARTAN POTASSIUM-HCTZ 100-12.5 MG PO TABS: 1.0000 | ORAL_TABLET | Freq: Every day | ORAL | 0 refills | Status: DC

## 2021-10-20 ENCOUNTER — Encounter: Payer: Self-pay | Admitting: Obstetrics & Gynecology

## 2021-10-20 ENCOUNTER — Other Ambulatory Visit: Payer: Self-pay

## 2021-10-20 ENCOUNTER — Ambulatory Visit (INDEPENDENT_AMBULATORY_CARE_PROVIDER_SITE_OTHER): Payer: Self-pay | Admitting: Obstetrics & Gynecology

## 2021-10-20 VITALS — BP 130/81 | HR 59 | Ht 67.0 in | Wt 199.0 lb

## 2021-10-20 DIAGNOSIS — Z9889 Other specified postprocedural states: Secondary | ICD-10-CM

## 2021-10-20 NOTE — Progress Notes (Signed)
  HPI: Patient returns for routine postoperative follow-up having undergone abdominal supracervical hysterectomy with RSO left salpingectomyon 09/06/21.  The patient's immediate postoperative recovery has been unremarkable. Since hospital discharge the patient reports usual post op issues .   Current Outpatient Medications: losartan-hydrochlorothiazide (HYZAAR) 100-12.5 MG tablet, Take 1 tablet by mouth daily., Disp: 90 tablet, Rfl: 0 potassium chloride SA (KLOR-CON) 20 MEQ tablet, Take 2 tablets (40 mEq total) by mouth daily., Disp: 30 tablet, Rfl: 2 promethazine (PHENERGAN) 25 MG tablet, Take 1 tablet (25 mg total) by mouth every 6 (six) hours as needed for nausea., Disp: 30 tablet, Rfl: 1 gabapentin (NEURONTIN) 300 MG capsule, Take 1 capsule (300 mg total) by mouth 3 (three) times daily. (Patient not taking: Reported on 09/15/2021), Disp: 12 capsule, Rfl: 0 ketorolac (TORADOL) 10 MG tablet, Take 1 tablet (10 mg total) by mouth every 8 (eight) hours as needed., Disp: 15 tablet, Rfl: 0 ondansetron (ZOFRAN ODT) 8 MG disintegrating tablet, Take 1 tablet (8 mg total) by mouth every 8 (eight) hours as needed for nausea or vomiting. (Patient not taking: Reported on 09/15/2021), Disp: 20 tablet, Rfl: 0 oxyCODONE-acetaminophen (PERCOCET) 7.5-325 MG tablet, Take 1-2 tablets by mouth every 4 (four) hours as needed for moderate pain., Disp: 15 tablet, Rfl: 0  No current facility-administered medications for this visit.    Blood pressure 134/84, pulse 80, height 5\' 6"  (1.676 m), weight 197 lb (89.4 kg), last menstrual period 08/14/2021.  Physical Exam: Incision clean dry intact healing well Abdomen is benign Cervix is normal  Diagnostic Tests:   Pathology: benign  Impression:   ICD-10-CM   1. S/P Abdominal supracervical hysterectomy with RSO and left salpingectomy 09/06/21  Z98.890          Plan: Sex is now ok    Follow up: Return if symptoms worsen or fail to  improve.    Florian Buff, MD

## 2022-01-16 ENCOUNTER — Ambulatory Visit (INDEPENDENT_AMBULATORY_CARE_PROVIDER_SITE_OTHER): Payer: 59 | Admitting: Family Medicine

## 2022-01-16 ENCOUNTER — Encounter: Payer: Self-pay | Admitting: Family Medicine

## 2022-01-16 VITALS — BP 168/103 | HR 67 | Temp 97.1°F | Resp 20 | Ht 67.0 in | Wt 200.0 lb

## 2022-01-16 DIAGNOSIS — D500 Iron deficiency anemia secondary to blood loss (chronic): Secondary | ICD-10-CM

## 2022-01-16 DIAGNOSIS — E876 Hypokalemia: Secondary | ICD-10-CM

## 2022-01-16 DIAGNOSIS — I10 Essential (primary) hypertension: Secondary | ICD-10-CM | POA: Diagnosis not present

## 2022-01-16 DIAGNOSIS — R2232 Localized swelling, mass and lump, left upper limb: Secondary | ICD-10-CM | POA: Diagnosis not present

## 2022-01-16 MED ORDER — AMLODIPINE BESYLATE 5 MG PO TABS: 5.0000 mg | ORAL_TABLET | Freq: Every day | ORAL | 1 refills | Status: DC

## 2022-01-16 MED ORDER — POTASSIUM CHLORIDE CRYS ER 20 MEQ PO TBCR: 40.0000 meq | EXTENDED_RELEASE_TABLET | Freq: Every day | ORAL | 2 refills | Status: DC

## 2022-01-16 NOTE — Progress Notes (Signed)
Subjective:  Patient ID: Ashley [name], female    DOB: 11-03-1976, 46 y.o.   MRN: [id number]  Patient Care Team: Baruch Gouty, FNP as PCP - General (Family Medicine)   Chief Complaint:  Establish Care (NEW )   HPI: Ashley [name] is a 46 y.o. female presenting on 01/16/2022 for Establish Care (NEW )   Patient presents today to establish care with new PCP.  She is currently followed by Dr. Anastasio Champion who unexpectedly passed away.  She was last seen in office around 8 months ago.  She recently underwent a hysterectomy due to fibroids and dysmenorrhea.  During admission for surgery her potassium and hemoglobin were noted to be low.  She is currently on potassium repletion therapy.  She is not on iron supplement.  She has hypertension and is on Hyzaar.  Reports her blood pressure has not been well controlled in several months.  She does report a slight headache at times.  No chest pain, leg swelling, shortness of breath, dizziness, weakness, palpitations, confusion, or visual changes.  She does have an ongoing issue with her left hand from a work injury and is seeing a hand specialist on a regular basis.  She reports over the last several weeks she has noticed a knot to her left shoulder which is painful to touch.  No reported injuries.  She has not had any work-up for this.       Relevant past medical, surgical, family, and social history reviewed and updated as indicated.  Allergies and medications reviewed and updated. Data reviewed: Chart in Epic.   Past Medical History:  Diagnosis Date   Hernia of abdominal cavity    Hypertension     Past Surgical History:  Procedure Laterality Date   CERVICAL CONIZATION W/BX N/A 09/06/2021   Procedure: ENDOCERVICAL CONIZATION;  Surgeon: Florian Buff, MD;  Location: [facility];  Service: Gynecology;  Laterality: N/A;   CHOLECYSTECTOMY  2011   HERNIA REPAIR     umbilical hernia   SALPINGOOPHORECTOMY Right 09/06/2021   Procedure: OPEN  RIGHT SALPINGO OOPHORECTOMY;  Surgeon: Florian Buff, MD;  Location: [facility];  Service: Gynecology;  Laterality: Right;   SUPRACERVICAL ABDOMINAL HYSTERECTOMY N/A 09/06/2021   Procedure: HYSTERECTOMY SUPRACERVICAL ABDOMINAL;  Surgeon: Florian Buff, MD;  Location: [facility];  Service: Gynecology;  Laterality: N/A;   TUBAL LIGATION  2009   UNILATERAL SALPINGECTOMY Left 09/06/2021   Procedure: LEFT SALPINGECTOMY;  Surgeon: Florian Buff, MD;  Location: [facility];  Service: Gynecology;  Laterality: Left;    Social History   Socioeconomic History   Marital status: Married    Spouse name: Randall Hiss   Number of children: 3   Years of education: Not on file   Highest education level: Not on file  Occupational History   Not on file  Tobacco Use   Smoking status: Never   Smokeless tobacco: Never  Vaping Use   Vaping Use: Never used  Substance and Sexual Activity   Alcohol use: Not Currently   Drug use: Never   Sexual activity: Not Currently    Birth control/protection: Surgical    Comment: tubal/hyst  Other Topics Concern   Not on file  Social History Narrative   Married since June 2004.Lives with husband and 3 girls.Works as Estate manager/land agent.   Social Determinants of Health   Financial Resource Strain: Medium Risk   Difficulty of Paying Living Expenses: Somewhat hard  Food Insecurity: No Food Insecurity  Worried About Charity fundraiser in the Last Year: Never true   [place] in the Last Year: Never true  Transportation Needs: No Transportation Needs   Lack of Transportation (Medical): No   Lack of Transportation (Non-Medical): No  Physical Activity: Inactive   Days of Exercise per Week: 0 days   Minutes of Exercise per Session: 0 min  Stress: No Stress Concern Present   Feeling of Stress : Not at all  Social Connections: Socially Integrated   Frequency of Communication with Friends and Family: More than three times a week   Frequency of Social Gatherings with Friends and  Family: More than three times a week   Attends Religious Services: More than 4 times per year   Active Member of [employer] or Organizations: Yes   Attends Music therapist: More than 4 times per year   Marital Status: Married  Human resources officer Violence: Not At Risk   Fear of Current or Ex-Partner: No   Emotionally Abused: No   Physically Abused: No   Sexually Abused: No    Outpatient Encounter Medications as of 01/16/2022  Medication Sig   amLODipine (NORVASC) 5 MG tablet Take 1 tablet (5 mg total) by mouth daily.   losartan-hydrochlorothiazide (HYZAAR) 100-12.5 MG tablet Take 1 tablet by mouth daily.   [DISCONTINUED] ketorolac (TORADOL) 10 MG tablet TAKE 1 TABLET BY MOUTH EVERY 8 HOURS AS NEEDED   [DISCONTINUED] potassium chloride SA (KLOR-CON) 20 MEQ tablet Take 2 tablets (40 mEq total) by mouth daily.   potassium chloride SA (KLOR-CON M) 20 MEQ tablet Take 2 tablets (40 mEq total) by mouth daily.   [DISCONTINUED] ondansetron (ZOFRAN ODT) 8 MG disintegrating tablet Take 1 tablet (8 mg total) by mouth every 8 (eight) hours as needed for nausea or vomiting. (Patient not taking: Reported on 10/20/2021)   [DISCONTINUED] promethazine (PHENERGAN) 25 MG tablet Take 1 tablet (25 mg total) by mouth every 6 (six) hours as needed for nausea. (Patient not taking: Reported on 10/20/2021)   No facility-administered encounter medications on file as of 01/16/2022.    No Known Allergies  Review of Systems  Constitutional:  Negative for activity change, appetite change, chills, diaphoresis, fatigue, fever and unexpected weight change.  HENT: Negative.    Eyes: Negative.  Negative for photophobia and visual disturbance.  Respiratory:  Negative for cough, chest tightness and shortness of breath.   Cardiovascular:  Negative for chest pain, palpitations and leg swelling.  Gastrointestinal:  Negative for abdominal pain, blood in stool, constipation, diarrhea, nausea and vomiting.  Endocrine:  Negative.   Genitourinary:  Negative for decreased urine volume, difficulty urinating, dysuria, frequency and urgency.  Musculoskeletal:  Positive for arthralgias, joint swelling and myalgias. Negative for back pain, gait problem, neck pain and neck stiffness.  Skin: Negative.   Allergic/Immunologic: Negative.   Neurological:  Negative for dizziness, tremors, seizures, syncope, facial asymmetry, speech difficulty, weakness, light-headedness, numbness and headaches.  Hematological: Negative.   Psychiatric/Behavioral:  Negative for confusion, hallucinations, sleep disturbance and suicidal ideas.   All other systems reviewed and are negative.      Objective:  BP (!) 168/103    Pulse 67    Temp (!) 97.1 F (36.2 C)    Resp 20    Ht 5' 7"  (1.702 m)    Wt 200 lb (90.7 kg)    LMP 08/14/2021    SpO2 99%    BMI 31.32 kg/m    Wt Readings from Last 3 Encounters:  01/16/22 200 lb (90.7 kg)  10/20/21 199 lb (90.3 kg)  10/08/21 197 lb 1.5 oz (89.4 kg)    Physical Exam Vitals and nursing note reviewed.  Constitutional:      General: She is not in acute distress.    Appearance: Normal appearance. She is well-developed and well-groomed. She is obese. She is not ill-appearing, toxic-appearing or diaphoretic.  HENT:     Head: Normocephalic and atraumatic.     Jaw: There is normal jaw occlusion.     Right Ear: Hearing normal.     Left Ear: Hearing normal.     Nose: Nose normal.     Mouth/Throat:     Lips: Pink.     Mouth: Mucous membranes are moist.     Pharynx: Oropharynx is clear. Uvula midline.  Eyes:     General: Lids are normal.     Extraocular Movements: Extraocular movements intact.     Conjunctiva/sclera: Conjunctivae normal.     Pupils: Pupils are equal, round, and reactive to light.  Neck:     Thyroid: No thyroid mass, thyromegaly or thyroid tenderness.     Vascular: No carotid bruit or JVD.     Trachea: Trachea and phonation normal.  Cardiovascular:     Rate and Rhythm: Normal  rate and regular rhythm.     Chest Wall: PMI is not displaced.     Pulses: Normal pulses.     Heart sounds: Normal heart sounds. No murmur heard.   No friction rub. No gallop.  Pulmonary:     Effort: Pulmonary effort is normal. No respiratory distress.     Breath sounds: Normal breath sounds. No wheezing.  Abdominal:     General: Bowel sounds are normal. There is no distension or abdominal bruit.     Palpations: Abdomen is soft. There is no hepatomegaly or splenomegaly.     Tenderness: There is no abdominal tenderness. There is no right CVA tenderness or left CVA tenderness.     Hernia: No hernia is present.  Musculoskeletal:     Cervical back: Normal range of motion and neck supple.     Right lower leg: No edema.     Left lower leg: No edema.     Comments: Left hand in brace.  Lymphadenopathy:     Cervical: No cervical adenopathy.  Skin:    General: Skin is warm and dry.     Capillary Refill: Capillary refill takes less than 2 seconds.     Coloration: Skin is not cyanotic, jaundiced or pale.     Findings: Lesion present. No rash.       Neurological:     General: No focal deficit present.     Mental Status: She is alert and oriented to person, place, and time.     Sensory: Sensation is intact.     Motor: Motor function is intact.     Coordination: Coordination is intact.     Gait: Gait is intact.     Deep Tendon Reflexes: Reflexes are normal and symmetric.  Psychiatric:        Attention and Perception: Attention and perception normal.        Mood and Affect: Mood and affect normal.        Speech: Speech normal.        Behavior: Behavior normal. Behavior is cooperative.        Thought Content: Thought content normal.        Cognition and Memory: Cognition and memory normal.  Judgment: Judgment normal.    Results for orders placed or performed during the hospital encounter of 10/08/21  CBC with Differential/Platelet  Result Value Ref Range   WBC 6.7 4.0 - 10.5 K/uL    RBC 3.55 (L) 3.87 - 5.11 MIL/uL   Hemoglobin 9.2 (L) 12.0 - 15.0 g/dL   HCT 29.8 (L) 36.0 - 46.0 %   MCV 83.9 80.0 - 100.0 fL   MCH 25.9 (L) 26.0 - 34.0 pg   MCHC 30.9 30.0 - 36.0 g/dL   RDW 13.9 11.5 - 15.5 %   Platelets 271 150 - 400 K/uL   nRBC 0.0 0.0 - 0.2 %   Neutrophils Relative % 63 %   Neutro Abs 4.2 1.7 - 7.7 K/uL   Lymphocytes Relative 27 %   Lymphs Abs 1.8 0.7 - 4.0 K/uL   Monocytes Relative 7 %   Monocytes Absolute 0.5 0.1 - 1.0 K/uL   Eosinophils Relative 2 %   Eosinophils Absolute 0.1 0.0 - 0.5 K/uL   Basophils Relative 1 %   Basophils Absolute 0.1 0.0 - 0.1 K/uL   Immature Granulocytes 0 %   Abs Immature Granulocytes 0.01 0.00 - 0.07 K/uL  Basic metabolic panel  Result Value Ref Range   Sodium 135 135 - 145 mmol/L   Potassium 3.4 (L) 3.5 - 5.1 mmol/L   Chloride 101 98 - 111 mmol/L   CO2 25 22 - 32 mmol/L   Glucose, Bld 109 (H) 70 - 99 mg/dL   BUN 12 6 - 20 mg/dL   Creatinine, Ser 1.09 (H) 0.44 - 1.00 mg/dL   Calcium 9.1 8.9 - 10.3 mg/dL   GFR, Estimated >60 >60 mL/min   Anion gap 9 5 - 15  Lactic acid, plasma  Result Value Ref Range   Lactic Acid, Venous 0.9 0.5 - 1.9 mmol/L       Pertinent labs & imaging results that were available during my care of the patient were reviewed by me and considered in my medical decision making.  Assessment & Plan:  Ashley [name] was seen today for establish care.  Diagnoses and all orders for this visit:  Primary hypertension BP not well controlled. Changes were made in regimen today, amlodipine added. Goal BP is 130/80. Pt aware to report any persistent high or low readings. DASH diet and exercise encouraged. Exercise at least 150 minutes per week and increase as tolerated. Goal BMI > 25. Stress management encouraged. Avoid nicotine and tobacco product use. Avoid excessive alcohol and NSAID's. Avoid more than 2000 mg of sodium daily. Medications as prescribed. Follow up as scheduled.  -     CBC with  Differential/Platelet -     CMP14+EGFR -     Lipid panel -     Thyroid Panel With TSH -     amLODipine (NORVASC) 5 MG tablet; Take 1 tablet (5 mg total) by mouth daily.  Hypokalemia Will recheck labs today. Will adjust repletion therapy if warranted.  -     potassium chloride SA (KLOR-CON M) 20 MEQ tablet; Take 2 tablets (40 mEq total) by mouth daily. -     CMP14+EGFR  Anemia due to chronic blood loss Hopefully resolved post hysterectomy.  -     CBC with Differential/Platelet  Mass of skin of left shoulder Soft tissue mass to left anterior shoulder.  Likely a lipoma.  Will obtain ultrasound for further evaluation. -     [place] LT UPPER EXTREM LTD SOFT TISSUE NON VASCULAR; Future  Continue all other maintenance medications.  Follow up plan: Return in about 2 weeks (around 01/30/2022), or if symptoms worsen or fail to improve, for HTN.   Continue healthy lifestyle choices, including diet (rich in fruits, vegetables, and lean proteins, and low in salt and simple carbohydrates) and exercise (at least 30 minutes of moderate physical activity daily).  Educational handout given for DASH diet, HTN  The above assessment and management plan was discussed with the patient. The patient verbalized understanding of and has agreed to the management plan. Patient is aware to call the clinic if they develop any new symptoms or if symptoms persist or worsen. Patient is aware when to return to the clinic for a follow-up visit. Patient educated on when it is appropriate to go to the emergency department.   Monia Pouch, FNP-C [facility] [phone]

## 2022-01-16 NOTE — Patient Instructions (Signed)

## 2022-01-17 LAB — CMP14+EGFR
ALT: 12 IU/L (ref 0–32)
AST: 22 IU/L (ref 0–40)
Albumin/Globulin Ratio: 1.4 (ref 1.2–2.2)
Albumin: 4.6 g/dL (ref 3.8–4.8)
Alkaline Phosphatase: 82 IU/L (ref 44–121)
BUN/Creatinine Ratio: 10 (ref 9–23)
BUN: 11 mg/dL (ref 6–24)
Bilirubin Total: 0.6 mg/dL (ref 0.0–1.2)
CO2: 23 mmol/L (ref 20–29)
Calcium: 9.8 mg/dL (ref 8.7–10.2)
Chloride: 100 mmol/L (ref 96–106)
Creatinine, Ser: 1.15 mg/dL — ABNORMAL HIGH (ref 0.57–1.00)
Globulin, Total: 3.4 g/dL (ref 1.5–4.5)
Glucose: 95 mg/dL (ref 70–99)
Potassium: 3.8 mmol/L (ref 3.5–5.2)
Sodium: 139 mmol/L (ref 134–144)
Total Protein: 8 g/dL (ref 6.0–8.5)
eGFR: 61 mL/min/{1.73_m2} (ref >59)

## 2022-01-17 LAB — THYROID PANEL WITH TSH
Free Thyroxine Index: 2.1 (ref 1.2–4.9)
T3 Uptake Ratio: 25 % (ref 24–39)
T4, Total: 8.2 ug/dL (ref 4.5–12.0)
TSH: 2.3 u[IU]/mL (ref 0.450–4.500)

## 2022-01-17 LAB — CBC WITH DIFFERENTIAL/PLATELET
Basophils Absolute: 0.1 10*3/uL (ref 0.0–0.2)
Basos: 1 %
EOS (ABSOLUTE): 0.1 10*3/uL (ref 0.0–0.4)
Eos: 1 %
Hematocrit: 32.6 % — ABNORMAL LOW (ref 34.0–46.6)
Hemoglobin: 9.9 g/dL — ABNORMAL LOW (ref 11.1–15.9)
Immature Grans (Abs): 0 10*3/uL (ref 0.0–0.1)
Immature Granulocytes: 0 %
Lymphocytes Absolute: 1.8 10*3/uL (ref 0.7–3.1)
Lymphs: 30 %
MCH: 22.1 pg — ABNORMAL LOW (ref 26.6–33.0)
MCHC: 30.4 g/dL — ABNORMAL LOW (ref 31.5–35.7)
MCV: 73 fL — ABNORMAL LOW (ref 79–97)
Monocytes Absolute: 0.5 10*3/uL (ref 0.1–0.9)
Monocytes: 8 %
Neutrophils Absolute: 3.7 10*3/uL (ref 1.4–7.0)
Neutrophils: 60 %
Platelets: 407 10*3/uL (ref 150–450)
RBC: 4.48 x10E6/uL (ref 3.77–5.28)
RDW: 16.7 % — ABNORMAL HIGH (ref 11.7–15.4)
WBC: 6.1 10*3/uL (ref 3.4–10.8)

## 2022-01-17 LAB — LIPID PANEL
Chol/HDL Ratio: 3.5 ratio (ref 0.0–4.4)
Cholesterol, Total: 156 mg/dL (ref 100–199)
HDL: 45 mg/dL (ref >39)
LDL Chol Calc (NIH): 99 mg/dL (ref 0–99)
Triglycerides: 57 mg/dL (ref 0–149)
VLDL Cholesterol Cal: 12 mg/dL (ref 5–40)

## 2022-01-23 ENCOUNTER — Other Ambulatory Visit: Payer: Self-pay

## 2022-01-23 ENCOUNTER — Ambulatory Visit (HOSPITAL_COMMUNITY)
Admission: RE | Admit: 2022-01-23 | Discharge: 2022-01-23 | Disposition: A | Discharge status: Home / Self Care | Payer: 59 | Source: Ambulatory Visit | Attending: Family Medicine | Admitting: Family Medicine

## 2022-01-23 DIAGNOSIS — R2232 Localized swelling, mass and lump, left upper limb: Secondary | ICD-10-CM | POA: Insufficient documentation

## 2022-01-24 ENCOUNTER — Encounter: Payer: Self-pay | Admitting: Family Medicine

## 2022-01-25 ENCOUNTER — Encounter: Payer: Self-pay | Admitting: Physical Medicine & Rehabilitation

## 2022-02-05 ENCOUNTER — Ambulatory Visit: Payer: Self-pay | Admitting: Nurse Practitioner

## 2022-02-06 ENCOUNTER — Encounter: Payer: Self-pay | Admitting: Family Medicine

## 2022-02-06 ENCOUNTER — Ambulatory Visit: Payer: 59 | Admitting: Family Medicine

## 2022-02-09 ENCOUNTER — Ambulatory Visit (INDEPENDENT_AMBULATORY_CARE_PROVIDER_SITE_OTHER): Payer: 59 | Admitting: Family Medicine

## 2022-02-09 ENCOUNTER — Encounter: Payer: Self-pay | Admitting: Family Medicine

## 2022-02-09 VITALS — BP 130/78 | HR 65 | Temp 97.5°F | Ht 67.0 in | Wt 207.2 lb

## 2022-02-09 DIAGNOSIS — R2232 Localized swelling, mass and lump, left upper limb: Secondary | ICD-10-CM | POA: Diagnosis not present

## 2022-02-09 DIAGNOSIS — M792 Neuralgia and neuritis, unspecified: Secondary | ICD-10-CM | POA: Diagnosis not present

## 2022-02-09 DIAGNOSIS — D1722 Benign lipomatous neoplasm of skin and subcutaneous tissue of left arm: Secondary | ICD-10-CM

## 2022-02-09 DIAGNOSIS — I10 Essential (primary) hypertension: Secondary | ICD-10-CM

## 2022-02-09 MED ORDER — METHYLPREDNISOLONE ACETATE 40 MG/ML IJ SUSP
40.0000 mg | Freq: Once | INTRAMUSCULAR | Status: AC
  Administered 2022-02-09: 40 mg via INTRAMUSCULAR

## 2022-02-09 NOTE — Progress Notes (Signed)
?  ? ?Subjective:  ?Patient ID: Ashley [name], female    DOB: 1976-08-31, 46 y.o.   MRN: [id number] ? ?Patient Care Team: ?Baruch Gouty, FNP as PCP - General (Family Medicine)  ? ?Chief Complaint:  Hypertension and Knot on shoulder ? ? ?HPI: ?Ashley [name] is a 46 y.o. female presenting on 02/09/2022 for Hypertension and Knot on shoulder ? ? ?Pt presents today for hypertension follow up. At last visit amlodipine was added to regimen as she was not controlled. She states she has been doing well with the addition and her numbers at home are great. No chest pain, palpitations, leg swelling, headaches, weakness, confusion, or visual changes.  ?She still complains of pain radiating down her arm from lipoma to left anterior shoulder. US imaging reviewed with pt in detail. Pt states she has pain which shoots down her arm to her hand from the shoulder.  ? ? ? ?Relevant past medical, surgical, family, and social history reviewed and updated as indicated.  ?Allergies and medications reviewed and updated. Data reviewed: Chart in Epic. ? ? ?Past Medical History:  ?Diagnosis Date  ? Hernia of abdominal cavity   ? Hypertension   ? ? ?Past Surgical History:  ?Procedure Laterality Date  ? CERVICAL CONIZATION W/BX N/A 09/06/2021  ? Procedure: ENDOCERVICAL CONIZATION;  Surgeon: Florian Buff, MD;  Location: [facility];  Service: Gynecology;  Laterality: N/A;  ? CHOLECYSTECTOMY  2011  ? HERNIA REPAIR    ? umbilical hernia  ? SALPINGOOPHORECTOMY Right 09/06/2021  ? Procedure: OPEN RIGHT SALPINGO OOPHORECTOMY;  Surgeon: Florian Buff, MD;  Location: [facility];  Service: Gynecology;  Laterality: Right;  ? SUPRACERVICAL ABDOMINAL HYSTERECTOMY N/A 09/06/2021  ? Procedure: HYSTERECTOMY SUPRACERVICAL ABDOMINAL;  Surgeon: Florian Buff, MD;  Location: [facility];  Service: Gynecology;  Laterality: N/A;  ? TUBAL LIGATION  2009  ? UNILATERAL SALPINGECTOMY Left 09/06/2021  ? Procedure: LEFT SALPINGECTOMY;  Surgeon: Florian Buff, MD;   Location: [facility];  Service: Gynecology;  Laterality: Left;  ? ? ?Social History  ? ?Socioeconomic History  ? Marital status: Married  ?  Spouse name: Randall Hiss  ? Number of children: 3  ? Years of education: Not on file  ? Highest education level: Not on file  ?Occupational History  ? Not on file  ?Tobacco Use  ? Smoking status: Never  ? Smokeless tobacco: Never  ?Vaping Use  ? Vaping Use: Never used  ?Substance and Sexual Activity  ? Alcohol use: Not Currently  ? Drug use: Never  ? Sexual activity: Not Currently  ?  Birth control/protection: Surgical  ?  Comment: tubal/hyst  ?Other Topics Concern  ? Not on file  ?Social History Narrative  ? Married since June 2004.Lives with husband and 3 girls.Works as Estate manager/land agent.  ? ?Social Determinants of Health  ? ?Financial Resource Strain: Medium Risk  ? Difficulty of Paying Living Expenses: Somewhat hard  ?Food Insecurity: No Food Insecurity  ? Worried About Charity fundraiser in the Last Year: Never true  ? Ran Out of Food in the Last Year: Never true  ?Transportation Needs: No Transportation Needs  ? Lack of Transportation (Medical): No  ? Lack of Transportation (Non-Medical): No  ?Physical Activity: Inactive  ? Days of Exercise per Week: 0 days  ? Minutes of Exercise per Session: 0 min  ?Stress: No Stress Concern Present  ? Feeling of Stress : Not at all  ?Social Connections: Socially Integrated  ? Frequency of  Communication with Friends and Family: More than three times a week  ? Frequency of Social Gatherings with Friends and Family: More than three times a week  ? Attends Religious Services: More than 4 times per year  ? Active Member of Clubs or Organizations: Yes  ? Attends Archivist Meetings: More than 4 times per year  ? Marital Status: Married  ?Intimate Partner Violence: Not At Risk  ? Fear of Current or Ex-Partner: No  ? Emotionally Abused: No  ? Physically Abused: No  ? Sexually Abused: No  ? ? ?Outpatient Encounter Medications as of 02/09/2022   ?Medication Sig  ? amLODipine (NORVASC) 5 MG tablet Take 1 tablet (5 mg total) by mouth daily.  ? losartan-hydrochlorothiazide (HYZAAR) 100-12.5 MG tablet Take 1 tablet by mouth daily.  ? potassium chloride SA (KLOR-CON M) 20 MEQ tablet Take 2 tablets (40 mEq total) by mouth daily.  ? [EXPIRED] methylPREDNISolone acetate (DEPO-MEDROL) injection 40 mg   ? ?No facility-administered encounter medications on file as of 02/09/2022.  ? ? ?No Known Allergies ? ?Review of Systems  ?Constitutional:  Negative for activity change, appetite change, chills, diaphoresis, fatigue, fever and unexpected weight change.  ?HENT: Negative.    ?Eyes: Negative.  Negative for photophobia and visual disturbance.  ?Respiratory:  Negative for cough, chest tightness and shortness of breath.   ?Cardiovascular:  Negative for chest pain, palpitations and leg swelling.  ?Gastrointestinal:  Negative for abdominal pain, blood in stool, constipation, diarrhea, nausea and vomiting.  ?Endocrine: Negative.   ?Genitourinary:  Negative for decreased urine volume, difficulty urinating, dysuria, frequency and urgency.  ?Musculoskeletal:  Positive for arthralgias and myalgias. Negative for back pain, gait problem, joint swelling, neck pain and neck stiffness.  ?Skin: Negative.   ?Allergic/Immunologic: Negative.   ?Neurological:  Negative for dizziness, weakness and headaches.  ?Hematological: Negative.   ?Psychiatric/Behavioral:  Positive for self-injury. Negative for confusion, hallucinations, sleep disturbance and suicidal ideas.   ?All other systems reviewed and are negative. ? ?   ? ?Objective:  ?BP 130/78   Pulse 65   Temp (!) 97.5 ?F (36.4 ?C) (Oral)   Ht '5\' 7"'$  (1.702 m)   Wt 207 lb 3.2 oz (94 kg)   LMP 08/14/2021   SpO2 100%   BMI 32.45 kg/m?   ? ?Wt Readings from Last 3 Encounters:  ?02/09/22 207 lb 3.2 oz (94 kg)  ?01/16/22 200 lb (90.7 kg)  ?10/20/21 199 lb (90.3 kg)  ? ? ?Physical Exam ?Vitals and nursing note reviewed.  ?Constitutional:    ?   General: She is not in acute distress. ?   Appearance: Normal appearance. She is well-developed and well-groomed. She is not ill-appearing, toxic-appearing or diaphoretic.  ?HENT:  ?   Head: Normocephalic and atraumatic.  ?   Jaw: There is normal jaw occlusion.  ?   Right Ear: Hearing normal.  ?   Left Ear: Hearing normal.  ?   Nose: Nose normal.  ?   Mouth/Throat:  ?   Lips: Pink.  ?   Mouth: Mucous membranes are moist.  ?   Pharynx: Oropharynx is clear. Uvula midline.  ?Eyes:  ?   General: Lids are normal.  ?   Extraocular Movements: Extraocular movements intact.  ?   Conjunctiva/sclera: Conjunctivae normal.  ?   Pupils: Pupils are equal, round, and reactive to light.  ?Neck:  ?   Thyroid: No thyroid mass, thyromegaly or thyroid tenderness.  ?   Vascular: No carotid bruit or  JVD.  ?   Trachea: Trachea and phonation normal.  ?Cardiovascular:  ?   Rate and Rhythm: Normal rate and regular rhythm.  ?   Chest Wall: PMI is not displaced.  ?   Pulses: Normal pulses.  ?   Heart sounds: Normal heart sounds. No murmur heard. ?  No friction rub. No gallop.  ?Pulmonary:  ?   Effort: Pulmonary effort is normal. No respiratory distress.  ?   Breath sounds: Normal breath sounds. No wheezing.  ?Abdominal:  ?   General: Bowel sounds are normal. There is no distension or abdominal bruit.  ?   Palpations: Abdomen is soft. There is no hepatomegaly or splenomegaly.  ?   Tenderness: There is no abdominal tenderness. There is no right CVA tenderness or left CVA tenderness.  ?   Hernia: No hernia is present.  ?Musculoskeletal:  ?   Left shoulder: Tenderness present. No swelling, deformity, effusion, laceration, bony tenderness or crepitus. Normal range of motion. Normal strength. Normal pulse.  ?   Left upper arm: Normal.  ?   Cervical back: Normal, normal range of motion and neck supple.  ?   Right lower leg: No edema.  ?   Left lower leg: No edema.  ?   Comments: Brace on left hand  ?Lymphadenopathy:  ?   Cervical: No cervical  adenopathy.  ?Skin: ?   General: Skin is warm and dry.  ?   Capillary Refill: Capillary refill takes less than 2 seconds.  ?   Coloration: Skin is not cyanotic, jaundiced or pale.  ?   Findings: No rash.  ? ?    ?Neurol

## 2022-02-09 NOTE — Patient Instructions (Signed)

## 2022-03-20 ENCOUNTER — Encounter
Payer: Worker's Compensation | Attending: Physical Medicine & Rehabilitation | Admitting: Physical Medicine & Rehabilitation

## 2022-03-20 ENCOUNTER — Encounter: Payer: Self-pay | Admitting: Physical Medicine & Rehabilitation

## 2022-03-20 VITALS — BP 167/95 | HR 68 | Ht 67.0 in | Wt 204.0 lb

## 2022-03-20 DIAGNOSIS — M79642 Pain in left hand: Secondary | ICD-10-CM | POA: Diagnosis present

## 2022-03-20 DIAGNOSIS — G90512 Complex regional pain syndrome I of left upper limb: Secondary | ICD-10-CM | POA: Diagnosis present

## 2022-03-20 MED ORDER — PREDNISONE 10 MG (21) PO TBPK: ORAL_TABLET | Freq: Every day | ORAL | 0 refills | Status: DC

## 2022-03-20 MED ORDER — GABAPENTIN 100 MG PO CAPS: 100.0000 mg | ORAL_CAPSULE | Freq: Every day | ORAL | 1 refills | Status: DC

## 2022-03-20 NOTE — Patient Instructions (Signed)
Complex Regional Pain Syndrome ?Complex regional pain syndrome (CRPS) is a nerve disorder that is characterized by long-term (chronic) pain. The pain is usually in a hand, arm, foot, or leg. CRPS usually occurs after an injury or trauma, such as a fracture or sprain. ?There are two types of CRPS: ?Type 1. This type occurs after an injury with no known damage to a nerve. ?Type 2. This type occurs after an injury that damages a nerve. ?There are three stages of the condition: ?Stage 1. This stage, called the acute stage, may last for up to 3 months. ?Stage 2. This stage, called the dystrophic stage, may last for 3-12 months. ?Stage 3. This stage, called the atrophic stage, may start after one year. ?CRPS ranges from mild to severe. For most people, CRPS is mild and recovery happens over time. For others, CRPS lasts for a very long time and makes it hard to do everyday tasks. ?What are the causes? ?The exact cause of this condition is not known. It is usually triggered by an injury. ?What increases the risk? ?You are more likely to develop this condition if: ?You are female. ?You have any of the following: ?A wrist fracture that involves a lower arm bone (distal radius fracture). ?Ankle dislocation or fracture. ?A long surgery time. ?Possible nerve injury during surgery. ?What are the signs or symptoms? ?Signs and symptoms in the affected hand, arm, foot, or leg are different for each stage. ?Signs and symptoms of stage 1 include: ?Spontaneous pain that feels like a burning or prickling, tingling feeling (pins and needles sensation). ?Extremely sensitive skin. ?Swelling. ?Joint stiffness. ?Warmth and redness. ?Excessive sweating. ?Hair and nail growth that is faster than normal. ?Signs and symptoms of stage 2 include: ?Spreading of pain to the whole arm or leg. ?Increased skin sensitivity. ?Increased swelling and stiffness. ?Coolness of the skin. ?Blue discoloration of skin. ?Loss of skin wrinkles. ?Brittle  fingernails. ?Signs and symptoms of stage 3 include: ?Pain that spreads to other areas of the body but becomes less severe. ?More stiffness, leading to loss of motion. ?Skin that is pale, dry, shiny, or tightly stretched. ?How is this diagnosed? ?This condition may be diagnosed based on: ?Your signs and symptoms. ?A physical exam. ?There is no test to diagnose CRPS, but you may have tests: ?To check for bone changes that might indicate CRPS. These tests may include an MRI or bone scan. ?To rule out other possible causes of your symptoms. ?How is this treated? ?Early treatment may prevent CRPS from advancing past stage 1. There is not one treatment that works for everyone. Treatment options may include: ?Medicines, which may include: ?NSAIDs, such as ibuprofen. ?Steroids. ?Blood pressure drugs. ?Antidepressants. ?Anti-seizure drugs. ?Pain relievers. ?Exercise. ?Occupational therapy and physical therapy. ?Biofeedback. ?Mental health counseling. ?Numbing injections. ?Spinal surgery to implant a spinal cord stimulator or a pain pump. ?Follow these instructions at home: ?Medicines ?Take over-the-counter and prescription medicines only as told by your health care provider. ?Ask your health care provider if the medicine prescribed to you: ?Requires you to avoid driving or using machinery. ?Can cause constipation. You may need to take these actions to prevent or treat constipation: ?Drink enough fluid to keep your urine pale yellow. ?Take over-the-counter or prescription medicines. ?Eat foods that are high in fiber, such as beans, whole grains, and fresh fruits and vegetables. ?Limit foods that are high in fat and processed sugars, such as fried or sweet foods. ?General instructions ?Do not use any products that contain nicotine  or tobacco. These products include cigarettes, chewing tobacco, and vaping devices, such as e-cigarettes. If you need help quitting, ask your health care provider. ?Maintain a healthy  weight. ?Return to your normal activities as told by your health care provider. Ask your health care provider what activities are safe for you. ?Do exercises as told by your health care provider. ?Keep all follow-up visits. This is important. ?Where to find more information ?[employer] of Neurological Disorders and Stroke: MasterBoxes[employer]it ?Contact a health care provider if: ?Your symptoms change. ?Your symptoms get worse. ?You develop anxiety or depression. ?Get help right away if: ?Your pain is making you want to harm yourself. ?Get help right away if you feel like you may hurt yourself or others, or have thoughts about taking your own life. Go to your nearest emergency room or: ?Call 911. ?Call the [facility] at [phone] or 988. This is open 24 hours a day. ?Text the Crisis Text Line at 406-413-7261. ?Summary ?Complex regional pain syndrome (CRPS) is a nerve disorder that causes long-term (chronic) pain, usually in a hand, arm, leg, or foot. ?CRPS usually occurs after an injury or trauma, such as a fracture or sprain. ?CRPS ranges from mild to severe. Early treatment may prevent CRPS from advancing to more severe stages. ?This information is not intended to replace advice given to you by your health care provider. Make sure you discuss any questions you have with your health care provider. ?Document Revised: 06/28/2021 Document Reviewed: 06/28/2021 ?Elsevier Patient Education ? [place]. ? ?

## 2022-03-20 NOTE — Progress Notes (Signed)
? ?Subjective:  ? ? Patient ID: Ashley [name], female    DOB: 1976/02/13, 46 y.o.   MRN: [id number] ? ?HPI ?DOI 03/09/2021 ?Hit left hand on a TV at work ?Has had pain in 3rd MCP ,  ?Left hand pain injury, had immediate swelling went to urgent care, no infection  ?Numbness and tingling in ulnar 3 digits of left hand ?No evidence of fracture on imaging studies. ?Pain radiates from Left hand to dorsal forearm.  Swelling in dorsum of Left hand has subsided ?Referred to orthopedic surgery, Dr. Apolonio Schneiders approximately 2 months post injury who ordered physical therapy. ?Imaging studies showed no fracture , last MRI showed mild swelling around extensor tendons greatest at 4th digit ? ?Current pain is intermittent but some days it is intermittent  ?Pt states pain limits function in digits 3,4,5, also has numbness and tingling of those fingers including the wrist  ? ?Pt has had hand OT followed by manipulation of Left hand under general anesthesia  ? ? ? ?Pain Inventory ?Average Pain 9 ?Pain Right Now 8 ?My pain is sharp, tingling, and aching ? ?In the last 24 hours, has pain interfered with the following? ?General activity 8 ?Relation with others 5 ?Enjoyment of life 10 ?What TIME of day is your pain at its worst? daytime and night ?Sleep (in general) Fair ? ?Pain is worse with: some activites ?Pain improves with: heat/ice, therapy/exercise, and pacing activities ?Relief from Meds: 5 ? ?do you drive?  yes ? 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 ?numbness ?tingling ?anxiety ? ?New pt ? ?New pt ? ? ? ?Family History  ?Problem Relation Age of Onset  ? Hypertension Mother   ? Cancer Mother   ?     skin related / graft  ? CVA Father   ? Crohn's disease Father   ? Heart murmur Sister   ? Hypertension Maternal Grandmother   ? Diabetes Maternal Grandmother   ? Dementia Maternal Grandfather   ? Pneumonia Paternal  Grandfather   ? ?Social History  ? ?Socioeconomic History  ? Marital status: Married  ?  Spouse name: Randall Hiss  ? Number of children: 3  ? Years of education: Not on file  ? Highest education level: Not on file  ?Occupational History  ? Not on file  ?Tobacco Use  ? Smoking status: Never  ? Smokeless tobacco: Never  ?Vaping Use  ? Vaping Use: Never used  ?Substance and Sexual Activity  ? Alcohol use: Not Currently  ? Drug use: Never  ? Sexual activity: Not Currently  ?  Birth control/protection: Surgical  ?  Comment: tubal/hyst  ?Other Topics Concern  ? Not on file  ?Social History Narrative  ? Married since June 2004.Lives with husband and 3 girls.Works as Estate manager/land agent.  ? ?Social Determinants of Health  ? ?Financial Resource Strain: Medium Risk  ? Difficulty of Paying Living Expenses: Somewhat hard  ?Food Insecurity: No Food Insecurity  ? Worried About Charity fundraiser in the Last Year: Never true  ? Ran Out of Food in the Last Year: Never true  ?Transportation Needs: No Transportation Needs  ? Lack of Transportation (Medical): No  ? Lack of Transportation (Non-Medical): No  ?Physical Activity: Inactive  ? Days of Exercise per Week: 0 days  ? Minutes of Exercise per Session: 0 min  ?Stress: No Stress Concern Present  ? Feeling  of Stress : Not at all  ?Social Connections: Socially Integrated  ? Frequency of Communication with Friends and Family: More than three times a week  ? Frequency of Social Gatherings with Friends and Family: More than three times a week  ? Attends Religious Services: More than 4 times per year  ? Active Member of Clubs or Organizations: Yes  ? Attends Archivist Meetings: More than 4 times per year  ? Marital Status: Married  ? ?Past Surgical History:  ?Procedure Laterality Date  ? CERVICAL CONIZATION W/BX N/A 09/06/2021  ? Procedure: ENDOCERVICAL CONIZATION;  Surgeon: Florian Buff, MD;  Location: [facility];  Service: Gynecology;  Laterality: N/A;  ? CHOLECYSTECTOMY  2011  ?  HERNIA REPAIR    ? umbilical hernia  ? SALPINGOOPHORECTOMY Right 09/06/2021  ? Procedure: OPEN RIGHT SALPINGO OOPHORECTOMY;  Surgeon: Florian Buff, MD;  Location: [facility];  Service: Gynecology;  Laterality: Right;  ? SUPRACERVICAL ABDOMINAL HYSTERECTOMY N/A 09/06/2021  ? Procedure: HYSTERECTOMY SUPRACERVICAL ABDOMINAL;  Surgeon: Florian Buff, MD;  Location: [facility];  Service: Gynecology;  Laterality: N/A;  ? TUBAL LIGATION  2009  ? UNILATERAL SALPINGECTOMY Left 09/06/2021  ? Procedure: LEFT SALPINGECTOMY;  Surgeon: Florian Buff, MD;  Location: [facility];  Service: Gynecology;  Laterality: Left;  ? ?Past Medical History:  ?Diagnosis Date  ? Hernia of abdominal cavity   ? Hypertension   ? ?BP (!) 168/112   Pulse 68   Ht '5\' 7"'$  (1.702 m)   Wt 204 lb (92.5 kg)   LMP 08/14/2021   SpO2 98%   BMI 31.95 kg/m?  ? ?Opioid Risk Score:   ?Fall Risk Score:  `1 ? ?Depression screen PHQ 2/9 ? ? ?  03/20/2022  ?  2:20 PM 02/09/2022  ? 10:18 AM 01/16/2022  ?  8:53 AM 05/16/2021  ?  2:24 PM 04/24/2021  ?  1:07 PM  ?Depression screen PHQ 2/9  ?Decreased Interest 0 0 0 1 0  ?Down, Depressed, Hopeless 0 0 0 0 0  ?PHQ - 2 Score 0 0 0 1 0  ?Altered sleeping 0 0  0 0  ?Tired, decreased energy 0 0  2 0  ?Change in appetite 0 0  1 0  ?Feeling bad or failure about yourself  0 0  0 0  ?Trouble concentrating 0 0  0 0  ?Moving slowly or fidgety/restless 0 0  0 0  ?Suicidal thoughts 0 0  0 0  ?PHQ-9 Score 0 0  4 0  ?Difficult doing work/chores Somewhat difficult Somewhat difficult   Not difficult at all  ?  ? ?Review of Systems  ?Musculoskeletal:   ?     Left hand pain  ?All other systems reviewed and are negative. ? ?   ?Objective:  ? Physical Exam ?Vitals and nursing note reviewed.  ?Constitutional:   ?   Appearance: She is obese.  ?HENT:  ?   Head: Normocephalic and atraumatic.  ?Eyes:  ?   Extraocular Movements: Extraocular movements intact.  ?   Conjunctiva/sclera: Conjunctivae normal.  ?   Pupils: Pupils are equal, round, and reactive to  light.  ?Musculoskeletal:  ?   Comments: There is normal range of motion right upper extremity at the shoulder elbow wrist and hand ?Left upper extremity has normal shoulder elbow range of motion normal thumb range of motion normal index finger range of motion ?Has severe contracture at finger extensors digits 345, mild left wrist extension contracture as well. ?  Normal cervical spine range of motion ?No pain with shoulder range of motion on the left side negative impingement sign  ?Skin: ?   General: Skin is warm and dry.  ?Neurological:  ?   Mental Status: She is alert and oriented to person, place, and time.  ?Psychiatric:     ?   Mood and Affect: Mood normal.     ?   Behavior: Behavior normal.  ? ?Hyperalgesia to pinprick in digits 345 of the left hand.  This does extend to the ulnar aspect of the left wrist. ?Mild tenderness to palpation at the flexor and extensor surface of this forearm, no no tenderness over the lateral or medial epicondyles. ?There is weakness of the finger flexors and finger extensors as well ? ? ?   ?Assessment & Plan:  ? ?1.  Probable complex regional pain syndrome type I pain does occur beyond the dermatome and myotome of 1 single nerve.  She does have some numbness tingling in the ulnar distribution primarily and therefore would recommend EMG/NCV to see if there is objective findings of nerve injury. ?Patient has hyperalgesia, allodynia, edema, no nail dystrophic changes no skin discoloration or sweating abnormalities. ?3/4 symptoms positive ?2/4 physical findings positive ?Discussed with case manager ?Start gabapentin 100 mg nightly ?Prednisone burst and taper 12 days, may repeat x1 if needed patient to call ?Discussed possible stellate ganglion block, would need referral for this. ?

## 2022-04-10 ENCOUNTER — Encounter: Payer: Self-pay | Admitting: Family Medicine

## 2022-05-25 ENCOUNTER — Encounter: Payer: Self-pay | Admitting: Physical Medicine & Rehabilitation

## 2022-05-25 ENCOUNTER — Telehealth: Payer: Self-pay | Admitting: *Deleted

## 2022-05-25 ENCOUNTER — Encounter: Payer: Worker's Compensation | Admitting: Physical Medicine & Rehabilitation

## 2022-05-25 NOTE — Telephone Encounter (Signed)
Pt was at a procedure appt earlier today which had to be cancelled. Her BP was 198/109, she is having no other symptoms. Appt made for Monday morning. Instructed to check BP over weekend if she develops any blurred vision, chest pain, etc she is to go to the ED

## 2022-05-28 ENCOUNTER — Encounter: Payer: Self-pay | Admitting: Family Medicine

## 2022-05-28 ENCOUNTER — Ambulatory Visit (INDEPENDENT_AMBULATORY_CARE_PROVIDER_SITE_OTHER): Payer: 59 | Admitting: Family Medicine

## 2022-05-28 VITALS — BP 141/93 | HR 58 | Temp 97.5°F | Ht 67.0 in | Wt 208.8 lb

## 2022-05-28 DIAGNOSIS — R631 Polydipsia: Secondary | ICD-10-CM | POA: Diagnosis not present

## 2022-05-28 DIAGNOSIS — I10 Essential (primary) hypertension: Secondary | ICD-10-CM

## 2022-05-28 LAB — URINALYSIS
Bilirubin, UA: NEGATIVE
Glucose, UA: NEGATIVE
Ketones, UA: NEGATIVE
Nitrite, UA: NEGATIVE
Protein,UA: NEGATIVE
RBC, UA: NEGATIVE
Specific Gravity, UA: 1.015 (ref 1.005–1.030)
Urobilinogen, Ur: 0.2 mg/dL (ref 0.2–1.0)
pH, UA: 6.5 (ref 5.0–7.5)

## 2022-05-28 LAB — BAYER DCA HB A1C WAIVED: HB A1C (BAYER DCA - WAIVED): 4.9 % (ref 4.8–5.6)

## 2022-05-28 MED ORDER — AMLODIPINE BESYLATE 10 MG PO TABS: 10.0000 mg | ORAL_TABLET | Freq: Every day | ORAL | 3 refills | Status: DC

## 2022-05-28 NOTE — Patient Instructions (Signed)
How to Take Your Blood Pressure Blood pressure measures how strongly your blood is pressing against the walls of your arteries. Arteries are blood vessels that carry blood from your heart throughout your body. You can take your blood pressure at home with a machine. You may need to check your blood pressure at home: To check if you have high blood pressure (hypertension). To check your blood pressure over time. To make sure your blood pressure medicine is working. Supplies needed: Blood pressure machine, or monitor. A chair to sit in. This should be a chair where you can sit upright with your back supported. Do not sit on a soft couch or an armchair. Table or desk. Small notebook. Pencil or pen. How to prepare Avoid these things for 30 minutes before checking your blood pressure: Having drinks with caffeine in them, such as coffee or tea. Drinking alcohol. Eating. Smoking. Exercising. Do these things five minutes before checking your blood pressure: Go to the bathroom and pee (urinate). Sit in a chair. Be quiet. Do not talk. How to take your blood pressure Follow the instructions that came with your machine. If you have a digital blood pressure monitor, these may be the instructions: Sit up straight. Place your feet on the floor. Do not cross your ankles or legs. Rest your left arm at the level of your heart. You may rest it on a table, desk, or chair. Pull up your shirt sleeve. Wrap the blood pressure cuff around the upper part of your left arm. The cuff should be 1 inch (2.5 cm) above your elbow. It is best to wrap the cuff around bare skin. Fit the cuff snugly around your arm, but not too tightly. You should be able to place only one finger between the cuff and your arm. Place the cord so that it rests in the bend of your elbow. Press the power button. Sit quietly while the cuff fills with air and loses air. Write down the numbers on the screen. Wait 2-3 minutes and then repeat  steps 1-10. What do the numbers mean? Two numbers make up your blood pressure. The first number is called systolic pressure. The second is called diastolic pressure. An example of a blood pressure reading is "120 over 80" (or 120/80). If you are an adult and do not have a medical condition, use this guide to find out if your blood pressure is normal: Normal First number: below 120. Second number: below 80. Elevated First number: 120-129. Second number: below 80. Hypertension stage 1 First number: 130-139. Second number: 80-89. Hypertension stage 2 First number: 140 or above. Second number: 90 or above. Your blood pressure is above normal even if only the first or only the second number is above normal. Follow these instructions at home: Medicines Take over-the-counter and prescription medicines only as told by your doctor. Tell your doctor if your medicine is causing side effects. General instructions Check your blood pressure as often as your doctor tells you to. Check your blood pressure at the same time every day. Take your monitor to your next doctor's appointment. Your doctor will: Make sure you are using it correctly. Make sure it is working right. Understand what your blood pressure numbers should be. Keep all follow-up visits. General tips You will need a blood pressure machine or monitor. Your doctor can suggest a monitor. You can buy one at a drugstore or online. When choosing one: Choose one with an arm cuff. Choose one that wraps around your   upper arm. Only one finger should fit between your arm and the cuff. Do not choose one that measures your blood pressure from your wrist or finger. Where to find more information American Heart Association: www.heart.org Contact a doctor if: Your blood pressure keeps being high. Your blood pressure is suddenly low. Get help right away if: Your first blood pressure number is higher than 180. Your second blood pressure number is  higher than 120. These symptoms may be an emergency. Do not wait to see if the symptoms will go away. Get help right away. Call 911. Summary Check your blood pressure at the same time every day. Avoid caffeine, alcohol, smoking, and exercise for 30 minutes before checking your blood pressure. Make sure you understand what your blood pressure numbers should be. This information is not intended to replace advice given to you by your health care provider. Make sure you discuss any questions you have with your health care provider. Document Revised: 07/13/2021 Document Reviewed: 07/13/2021 Elsevier Patient Education  [place] Eating Plan DASH stands for Dietary Approaches to Stop Hypertension. The DASH eating plan is a healthy eating plan that has been shown to: Reduce high blood pressure (hypertension). Reduce your risk for type 2 diabetes, heart disease, and stroke. Help with weight loss. What are tips for following this plan? Reading food labels Check food labels for the amount of salt (sodium) per serving. Choose foods with less than 5 percent of the Daily Value of sodium. Generally, foods with less than 300 milligrams (mg) of sodium per serving fit into this eating plan. To find whole grains, look for the word "whole" as the first word in the ingredient list. Shopping Buy products labeled as "low-sodium" or "no salt added." Buy fresh foods. Avoid canned foods and pre-made or frozen meals. Cooking Avoid adding salt when cooking. Use salt-free seasonings or herbs instead of table salt or sea salt. Check with your health care provider or pharmacist before using salt substitutes. Do not fry foods. Cook foods using healthy methods such as baking, boiling, grilling, roasting, and broiling instead. Cook with heart-healthy oils, such as olive, canola, avocado, soybean, or sunflower oil. Meal planning  Eat a balanced diet that includes: 4 or more servings of fruits and 4 or  more servings of vegetables each day. Try to fill one-half of your plate with fruits and vegetables. 6-8 servings of whole grains each day. Less than 6 oz (170 g) of lean meat, poultry, or fish each day. A 3-oz (85-g) serving of meat is about the same size as a deck of cards. One egg equals 1 oz (28 g). 2-3 servings of low-fat dairy each day. One serving is 1 cup (237 mL). 1 serving of nuts, seeds, or beans 5 times each week. 2-3 servings of heart-healthy fats. Healthy fats called omega-3 fatty acids are found in foods such as walnuts, flaxseeds, fortified milks, and eggs. These fats are also found in cold-water fish, such as sardines, salmon, and mackerel. Limit how much you eat of: Canned or prepackaged foods. Food that is high in trans fat, such as some fried foods. Food that is high in saturated fat, such as fatty meat. Desserts and other sweets, sugary drinks, and other foods with added sugar. Full-fat dairy products. Do not salt foods before eating. Do not eat more than 4 egg yolks a week. Try to eat at least 2 vegetarian meals a week. Eat more home-cooked food and less restaurant, buffet, and fast  food. Lifestyle When eating at a restaurant, ask that your food be prepared with less salt or no salt, if possible. If you drink alcohol: Limit how much you use to: 0-1 drink a day for women who are not pregnant. 0-2 drinks a day for men. Be aware of how much alcohol is in your drink. In the [place] one drink equals one 12 oz bottle of beer (355 mL), one 5 oz glass of wine (148 mL), or one 1 oz glass of hard liquor (44 mL). General information Avoid eating more than 2,300 mg of salt a day. If you have hypertension, you may need to reduce your sodium intake to 1,500 mg a day. Work with your health care provider to maintain a healthy body weight or to lose weight. Ask what an ideal weight is for you. Get at least 30 minutes of exercise that causes your heart to beat faster (aerobic exercise)  most days of the week. Activities may include walking, swimming, or biking. Work with your health care provider or dietitian to adjust your eating plan to your individual calorie needs. What foods should I eat? Fruits All fresh, dried, or frozen fruit. Canned fruit in natural juice (without added sugar). Vegetables Fresh or frozen vegetables (raw, steamed, roasted, or grilled). Low-sodium or reduced-sodium tomato and vegetable juice. Low-sodium or reduced-sodium tomato sauce and tomato paste. Low-sodium or reduced-sodium canned vegetables. Grains Whole-grain or whole-wheat bread. Whole-grain or whole-wheat pasta. Brown rice. [place]. Bulgur. Whole-grain and low-sodium cereals. Pita bread. Low-fat, low-sodium crackers. Whole-wheat flour tortillas. Meats and other proteins Skinless chicken or [place]. Ground chicken or [place]. Pork with fat trimmed off. Fish and seafood. Egg whites. Dried beans, peas, or lentils. Unsalted nuts, nut butters, and seeds. Unsalted canned beans. Lean cuts of beef with fat trimmed off. Low-sodium, lean precooked or cured meat, such as sausages or meat loaves. Dairy Low-fat (1%) or fat-free (skim) milk. Reduced-fat, low-fat, or fat-free cheeses. Nonfat, low-sodium ricotta or cottage cheese. Low-fat or nonfat yogurt. Low-fat, low-sodium cheese. Fats and oils Soft margarine without trans fats. Vegetable oil. Reduced-fat, low-fat, or light mayonnaise and salad dressings (reduced-sodium). Canola, safflower, olive, avocado, soybean, and sunflower oils. Avocado. Seasonings and condiments Herbs. Spices. Seasoning mixes without salt. Other foods Unsalted popcorn and pretzels. Fat-free sweets. The items listed above may not be a complete list of foods and beverages you can eat. Contact a dietitian for more information. What foods should I avoid? Fruits Canned fruit in a light or heavy syrup. Fried fruit. Fruit in cream or butter sauce. Vegetables Creamed or fried  vegetables. Vegetables in a cheese sauce. Regular canned vegetables (not low-sodium or reduced-sodium). Regular canned tomato sauce and paste (not low-sodium or reduced-sodium). Regular tomato and vegetable juice (not low-sodium or reduced-sodium). Angie Fava. Olives. Grains Baked goods made with fat, such as croissants, muffins, or some breads. Dry pasta or rice meal packs. Meats and other proteins Fatty cuts of meat. Ribs. Fried meat. Berniece Salines. Bologna, salami, and other precooked or cured meats, such as sausages or meat loaves. Fat from the back of a pig (fatback). Bratwurst. Salted nuts and seeds. Canned beans with added salt. Canned or smoked fish. Whole eggs or egg yolks. Chicken or [place] with skin. Dairy Whole or 2% milk, cream, and half-and-half. Whole or full-fat cream cheese. Whole-fat or sweetened yogurt. Full-fat cheese. Nondairy creamers. Whipped toppings. Processed cheese and cheese spreads. Fats and oils Butter. Stick margarine. Lard. Shortening. Ghee. Bacon fat. Tropical oils, such as coconut, palm kernel, or palm  oil. Seasonings and condiments Onion salt, garlic salt, seasoned salt, table salt, and sea salt. Worcestershire sauce. Tartar sauce. Barbecue sauce. Teriyaki sauce. Soy sauce, including reduced-sodium. Steak sauce. Canned and packaged gravies. Fish sauce. Oyster sauce. Cocktail sauce. Store-bought horseradish. Ketchup. Mustard. Meat flavorings and tenderizers. Bouillon cubes. Hot sauces. Pre-made or packaged marinades. Pre-made or packaged taco seasonings. Relishes. Regular salad dressings. Other foods Salted popcorn and pretzels. The items listed above may not be a complete list of foods and beverages you should avoid. Contact a dietitian for more information. Where to find more information National Heart, Lung, and Blood Institute: https://wilson-eaton.com/ American Heart Association: www.heart.org Academy of Nutrition and Dietetics: www.eatright.[place]:  www.kidney.org Summary The DASH eating plan is a healthy eating plan that has been shown to reduce high blood pressure (hypertension). It may also reduce your risk for type 2 diabetes, heart disease, and stroke. When on the DASH eating plan, aim to eat more fresh fruits and vegetables, whole grains, lean proteins, low-fat dairy, and heart-healthy fats. With the DASH eating plan, you should limit salt (sodium) intake to 2,300 mg a day. If you have hypertension, you may need to reduce your sodium intake to 1,500 mg a day. Work with your health care provider or dietitian to adjust your eating plan to your individual calorie needs. This information is not intended to replace advice given to you by your health care provider. Make sure you discuss any questions you have with your health care provider. Document Revised: 10/02/2019 Document Reviewed: 10/02/2019 Elsevier Patient Education  [place].

## 2022-05-28 NOTE — Progress Notes (Signed)
Subjective: JA:[id number] HTN PCP: Baruch Gouty, FNP PFX:[id number] M Ashley [name] is a 46 y.o. female presenting to clinic today for:  1. Hypertension Noted to be quite elevated on Friday.  Unfortunately, had to cancel her appt for eval that day.  She is treated with amlodipine 5 mg daily and losartan-hydrochlorothiazide 100-12.5 mg daily.  She typically takes her Norvasc at nighttime and her combo pill in the morning.  She does report varying degrees of salt intake but did try to abstain totally from sodas and reduce salt 1 week but it really made little difference in her blood pressure.  She is not sure if the blood pressure elevations are coming from pain in her left wrist, which will require some type of surgical intervention but this has been delayed due to uncontrolled blood pressures.  She is taking oral NSAIDs OTC pretty regularly to control pain in the left wrist.  Her blood pressures have been elevated at home typically in the 150s over 80s as well as at work 150s to 160s.  She reports occasional dizziness, headache and has been really thirsty as of late.  She admits to eating convenience foods often and drinking sodas on a fairly regular basis.   ROS: Per HPI  No Known Allergies Past Medical History:  Diagnosis Date   Hernia of abdominal cavity    Hypertension     Current Outpatient Medications:    amLODipine (NORVASC) 5 MG tablet, Take 1 tablet (5 mg total) by mouth daily., Disp: 90 tablet, Rfl: 1   gabapentin (NEURONTIN) 100 MG capsule, Take 1 capsule (100 mg total) by mouth at bedtime. (Patient not taking: Reported on 05/25/2022), Disp: 30 capsule, Rfl: 1   ketorolac (TORADOL) 10 MG tablet, Take 1 tablet by mouth every 8 (eight) hours as needed., Disp: , Rfl:    losartan-hydrochlorothiazide (HYZAAR) 100-12.5 MG tablet, Take 1 tablet by mouth daily., Disp: 90 tablet, Rfl: 0   potassium chloride SA (KLOR-CON M) 20 MEQ tablet, Take 2 tablets (40 mEq total) by mouth daily., Disp: 30  tablet, Rfl: 2   predniSONE (STERAPRED UNI-PAK 21 TAB) 10 MG (21) TBPK tablet, Take by mouth daily. 4 tablets by mouth every morning for 3 days then 3 tablets by mouth every morning for 3 days then 2 tablets by mouth every morning for 3 days then 1 tablet by mouth every morning for 3 days then Stop, Disp: 30 tablet, Rfl: 0 Social History   Socioeconomic History   Marital status: Married    Spouse name: Randall Hiss   Number of children: 3   Years of education: Not on file   Highest education level: Not on file  Occupational History   Not on file  Tobacco Use   Smoking status: Never   Smokeless tobacco: Never  Vaping Use   Vaping Use: Never used  Substance and Sexual Activity   Alcohol use: Not Currently   Drug use: Never   Sexual activity: Not Currently    Birth control/protection: Surgical    Comment: tubal/hyst  Other Topics Concern   Not on file  Social History Narrative   Married since June 2004.Lives with husband and 3 girls.Works as Estate manager/land agent.   Social Determinants of Health   Financial Resource Strain: Medium Risk (05/16/2021)   Overall Financial Resource Strain (CARDIA)    Difficulty of Paying Living Expenses: Somewhat hard  Food Insecurity: No Food Insecurity (05/16/2021)   Hunger Vital Sign    Worried About Running Out of Food in  the Last Year: Never true    [place] in the Last Year: Never true  Transportation Needs: No Transportation Needs (05/16/2021)   PRAPARE - Hydrologist (Medical): No    Lack of Transportation (Non-Medical): No  Physical Activity: Inactive (05/16/2021)   Exercise Vital Sign    Days of Exercise per Week: 0 days    Minutes of Exercise per Session: 0 min  Stress: No Stress Concern Present (05/16/2021)   [place]    Feeling of Stress : Not at all  Social Connections: [place] (05/16/2021)   Social Connection and Isolation Panel [NHANES]     Frequency of Communication with Friends and Family: More than three times a week    Frequency of Social Gatherings with Friends and Family: More than three times a week    Attends Religious Services: More than 4 times per year    Active Member of [employer] or Organizations: Yes    Attends Music therapist: More than 4 times per year    Marital Status: Married  Human resources officer Violence: Not At Risk (05/16/2021)   Humiliation, Afraid, Rape, and Kick questionnaire    Fear of Current or Ex-Partner: No    Emotionally Abused: No    Physically Abused: No    Sexually Abused: No   Family History  Problem Relation Age of Onset   Hypertension Mother    Cancer Mother        skin related / graft   CVA Father    Crohn's disease Father    Heart murmur Sister    Hypertension Maternal Grandmother    Diabetes Maternal Grandmother    Dementia Maternal Grandfather    Pneumonia Paternal Grandfather     Objective: Office vital signs reviewed. BP (!) 141/93   Pulse (!) 58   Temp (!) 97.5 F (36.4 C)   Ht '5\' 7"'$  (1.702 m)   Wt 208 lb 12.8 oz (94.7 kg)   LMP 08/14/2021   SpO2 98%   BMI 32.70 kg/m   Physical Examination:  General: Awake, alert, well nourished, well-appearing female.  No acute distress HEENT: Normal    Ears: Tympanic membranes intact, normal light reflex, no erythema, no bulging    Eyes: PERRLA, extraocular membranes intact, sclera white Cardio: regular rate and rhythm, S1S2 heard, no murmurs appreciated Pulm: clear to auscultation bilaterally, no wheezes, rhonchi or rales; normal work of breathing on room air Extremities: warm, well perfused, No edema, cyanosis or clubbing; +2 pulses bilaterally MSK: Golf ball sized lipoma on the left anterior shoulder.  Left wrist in brace Neuro: No focal neurologic deficits.  No nystagmus  Assessment/ Plan: 46 y.o. female   Primary hypertension - Plan: Basic Metabolic Panel, Urinalysis, amLODipine (NORVASC) 10 MG  tablet  Excessive thirst - Plan: Bayer DCA Hb A1c Waived  Blood pressure is not controlled even upon recheck today.  I reviewed her EMR which did show quite a bit of elevation in blood pressures, particularly at the orthopedic offices.  I am going to advance her to 10 mg of amlodipine daily.  I encouraged her to monitor her blood pressures closely with goal of less than 140/90.  Salt restriction is encouraged.  I suspect that the elevations in blood pressure are likely multifactorial and due to diet, use of NSAIDs, pain and probably stress related to her unresolved orthopedic issues.  I would like her to get a BMP  and urinalysis to look for any type of protein in the urine given elevations in blood pressure and reports of dizziness.  Her ENT exam was unremarkable.  She had no neurologic deficits on exam today.  She will get a new set of eyeglasses soon so hopefully this will help with some of the headaches that she has been experiencing.  I am going to check an A1c given reports of excessive thirst and unbalanced diet.  We will contact the patient with her results via MyChart per her request as they become available.  No orders of the defined types were placed in this encounter.  No orders of the defined types were placed in this encounter.    Janora Norlander, DO [place] [phone]

## 2022-05-29 LAB — BASIC METABOLIC PANEL
BUN/Creatinine Ratio: 14 (ref 9–23)
BUN: 16 mg/dL (ref 6–24)
CO2: 24 mmol/L (ref 20–29)
Calcium: 9.6 mg/dL (ref 8.7–10.2)
Chloride: 102 mmol/L (ref 96–106)
Creatinine, Ser: 1.15 mg/dL — ABNORMAL HIGH (ref 0.57–1.00)
Glucose: 92 mg/dL (ref 70–99)
Potassium: 3.9 mmol/L (ref 3.5–5.2)
Sodium: 139 mmol/L (ref 134–144)
eGFR: 61 mL/min/{1.73_m2} (ref >59)

## 2022-06-12 ENCOUNTER — Ambulatory Visit: Payer: 59 | Admitting: Family Medicine

## 2022-06-12 ENCOUNTER — Ambulatory Visit (INDEPENDENT_AMBULATORY_CARE_PROVIDER_SITE_OTHER): Payer: 59 | Admitting: Family Medicine

## 2022-06-12 ENCOUNTER — Encounter: Payer: Self-pay | Admitting: Family Medicine

## 2022-06-12 VITALS — BP 132/78 | HR 55 | Temp 98.8°F | Ht 67.0 in | Wt 210.0 lb

## 2022-06-12 DIAGNOSIS — I10 Essential (primary) hypertension: Secondary | ICD-10-CM | POA: Diagnosis not present

## 2022-06-12 NOTE — Patient Instructions (Signed)

## 2022-06-12 NOTE — Progress Notes (Signed)
Subjective:  Patient ID: Ashley [name], female    DOB: 16-Apr-1976, 46 y.o.   MRN: [id number]  Patient Care Team: Baruch Gouty, FNP as PCP - General (Family Medicine)   Chief Complaint:  Hypertension (Left hand pain, work injury)   HPI: Ashley [name] is a 46 y.o. female presenting on 06/12/2022 for Hypertension (Left hand pain, work injury)   Pt presents today for reevaluation of hypertension after increasing amlodipine to 10 mg daily. She has been doing well with current regimen. Has noticed some slight swelling in her ankles in the afternoons.  Hypertension This is a chronic problem. The problem has been waxing and waning since onset. The problem is controlled. Associated symptoms include peripheral edema. Pertinent negatives include no anxiety, blurred vision, chest pain, headaches, malaise/fatigue, neck pain, orthopnea, palpitations, PND, shortness of breath or sweats. Risk factors for coronary artery disease include obesity. Past treatments include angiotensin blockers, diuretics and calcium channel blockers. The current treatment provides significant improvement. Hypertensive end-organ damage includes kidney disease.    Relevant past medical, surgical, family, and social history reviewed and updated as indicated.  Allergies and medications reviewed and updated. Data reviewed: Chart in Epic.   Past Medical History:  Diagnosis Date   Hernia of abdominal cavity    Hypertension     Past Surgical History:  Procedure Laterality Date   CERVICAL CONIZATION W/BX N/A 09/06/2021   Procedure: ENDOCERVICAL CONIZATION;  Surgeon: Florian Buff, MD;  Location: [facility];  Service: Gynecology;  Laterality: N/A;   CHOLECYSTECTOMY  2011   HERNIA REPAIR     umbilical hernia   SALPINGOOPHORECTOMY Right 09/06/2021   Procedure: OPEN RIGHT SALPINGO OOPHORECTOMY;  Surgeon: Florian Buff, MD;  Location: [facility];  Service: Gynecology;  Laterality: Right;   SUPRACERVICAL ABDOMINAL  HYSTERECTOMY N/A 09/06/2021   Procedure: HYSTERECTOMY SUPRACERVICAL ABDOMINAL;  Surgeon: Florian Buff, MD;  Location: [facility];  Service: Gynecology;  Laterality: N/A;   TUBAL LIGATION  2009   UNILATERAL SALPINGECTOMY Left 09/06/2021   Procedure: LEFT SALPINGECTOMY;  Surgeon: Florian Buff, MD;  Location: [facility];  Service: Gynecology;  Laterality: Left;    Social History   Socioeconomic History   Marital status: Married    Spouse name: Randall Hiss   Number of children: 3   Years of education: Not on file   Highest education level: Not on file  Occupational History   Not on file  Tobacco Use   Smoking status: Never   Smokeless tobacco: Never  Vaping Use   Vaping Use: Never used  Substance and Sexual Activity   Alcohol use: Not Currently   Drug use: Never   Sexual activity: Not Currently    Birth control/protection: Surgical    Comment: tubal/hyst  Other Topics Concern   Not on file  Social History Narrative   Married since June 2004.Lives with husband and 3 girls.Works as Estate manager/land agent.   Social Determinants of Health   Financial Resource Strain: Medium Risk (05/16/2021)   Overall Financial Resource Strain (CARDIA)    Difficulty of Paying Living Expenses: Somewhat hard  Food Insecurity: No Food Insecurity (05/16/2021)   Hunger Vital Sign    Worried About Running Out of Food in the Last Year: Never true    Ran Out of Food in the Last Year: Never true  Transportation Needs: No Transportation Needs (05/16/2021)   PRAPARE - Hydrologist (Medical): No    Lack of Transportation (Non-Medical):  No  Physical Activity: Inactive (05/16/2021)   Exercise Vital Sign    Days of Exercise per Week: 0 days    Minutes of Exercise per Session: 0 min  Stress: No Stress Concern Present (05/16/2021)   [place]    Feeling of Stress : Not at all  Social Connections: [place] (05/16/2021)   Social  Connection and Isolation Panel [NHANES]    Frequency of Communication with Friends and Family: More than three times a week    Frequency of Social Gatherings with Friends and Family: More than three times a week    Attends Religious Services: More than 4 times per year    Active Member of [employer] or Organizations: Yes    Attends Music therapist: More than 4 times per year    Marital Status: Married  Human resources officer Violence: Not At Risk (05/16/2021)   Humiliation, Afraid, Rape, and Kick questionnaire    Fear of Current or Ex-Partner: No    Emotionally Abused: No    Physically Abused: No    Sexually Abused: No    Outpatient Encounter Medications as of 06/12/2022  Medication Sig   amLODipine (NORVASC) 10 MG tablet Take 1 tablet (10 mg total) by mouth daily. For high blood pressure   gabapentin (NEURONTIN) 100 MG capsule Take 1 capsule (100 mg total) by mouth at bedtime.   ketorolac (TORADOL) 10 MG tablet Take 1 tablet by mouth every 8 (eight) hours as needed.   losartan-hydrochlorothiazide (HYZAAR) 100-12.5 MG tablet Take 1 tablet by mouth daily.   potassium chloride SA (KLOR-CON M) 20 MEQ tablet Take 2 tablets (40 mEq total) by mouth daily.   No facility-administered encounter medications on file as of 06/12/2022.    No Known Allergies  Review of Systems  Constitutional:  Negative for activity change, appetite change, chills, diaphoresis, fatigue, fever, malaise/fatigue and unexpected weight change.  HENT: Negative.    Eyes: Negative.  Negative for blurred vision, photophobia and visual disturbance.  Respiratory:  Negative for cough, chest tightness and shortness of breath.   Cardiovascular:  Positive for leg swelling. Negative for chest pain, palpitations, orthopnea and PND.  Gastrointestinal:  Negative for abdominal pain, blood in stool, constipation, diarrhea, nausea and vomiting.  Endocrine: Negative.  Negative for polydipsia, polyphagia and polyuria.  Genitourinary:   Negative for decreased urine volume, difficulty urinating, dysuria, frequency and urgency.  Musculoskeletal:  Negative for arthralgias, myalgias and neck pain.  Skin: Negative.   Allergic/Immunologic: Negative.   Neurological:  Negative for dizziness, tremors, seizures, syncope, facial asymmetry, speech difficulty, weakness, light-headedness, numbness and headaches.  Hematological: Negative.   Psychiatric/Behavioral:  Negative for confusion, hallucinations, sleep disturbance and suicidal ideas.   All other systems reviewed and are negative.       Objective:  BP 132/78   Pulse (!) 55   Temp 98.8 F (37.1 C)   Ht $R'5\' 7"'CU$  (1.702 m)   Wt 210 lb (95.3 kg)   LMP 08/14/2021   SpO2 97%   BMI 32.89 kg/m    Wt Readings from Last 3 Encounters:  06/12/22 210 lb (95.3 kg)  05/28/22 208 lb 12.8 oz (94.7 kg)  05/25/22 210 lb (95.3 kg)    Physical Exam Vitals and nursing note reviewed.  Constitutional:      Appearance: Normal appearance. She is obese.  HENT:     Head: Normocephalic and atraumatic.     Mouth/Throat:     Mouth: Mucous membranes are  moist.  Eyes:     Pupils: Pupils are equal, round, and reactive to light.  Cardiovascular:     Rate and Rhythm: Normal rate and regular rhythm.     Heart sounds: Normal heart sounds. No murmur heard.    No friction rub. No gallop.  Pulmonary:     Effort: Pulmonary effort is normal.     Breath sounds: Normal breath sounds.  Musculoskeletal:     Right lower leg: No edema.     Left lower leg: No edema.     Comments: Brace on left hand  Neurological:     General: No focal deficit present.     Mental Status: She is alert and oriented to person, place, and time.  Psychiatric:        Mood and Affect: Mood normal.        Behavior: Behavior normal.        Thought Content: Thought content normal.        Judgment: Judgment normal.     Results for orders placed or performed in visit on 16/60/63  Basic Metabolic Panel  Result Value Ref Range    Glucose 92 70 - 99 mg/dL   BUN 16 6 - 24 mg/dL   Creatinine, Ser 1.15 (H) 0.57 - 1.00 mg/dL   eGFR 61 >59 mL/min/1.73   BUN/Creatinine Ratio 14 9 - 23   Sodium 139 134 - 144 mmol/L   Potassium 3.9 3.5 - 5.2 mmol/L   Chloride 102 96 - 106 mmol/L   CO2 24 20 - 29 mmol/L   Calcium 9.6 8.7 - 10.2 mg/dL  Urinalysis  Result Value Ref Range   Specific Gravity, UA 1.015 1.005 - 1.030   pH, UA 6.5 5.0 - 7.5   Color, UA Yellow Yellow   Appearance Ur Clear Clear   Leukocytes,UA Trace (A) Negative   Protein,UA Negative Negative/Trace   Glucose, UA Negative Negative   Ketones, UA Negative Negative   RBC, UA Negative Negative   Bilirubin, UA Negative Negative   Urobilinogen, Ur 0.2 0.2 - 1.0 mg/dL   Nitrite, UA Negative Negative  Bayer DCA Hb A1c Waived  Result Value Ref Range   HB A1C (BAYER DCA - WAIVED) 4.9 4.8 - 5.6 %       Pertinent labs & imaging results that were available during my care of the patient were reviewed by me and considered in my medical decision making.  Assessment & Plan:  Ashley [name] was seen today for hypertension.  Diagnoses and all orders for this visit:  Primary hypertension BP well controlled. Changes were not made in regimen today. Goal BP is 130/80. Pt aware to report any persistent high or low readings. DASH diet and exercise encouraged. Exercise at least 150 minutes per week and increase as tolerated. Goal BMI > 25. Stress management encouraged. Avoid nicotine and tobacco product use. Avoid excessive alcohol and NSAID's. Avoid more than 2000 mg of sodium daily. Medications as prescribed. Follow up as scheduled.  -     BMP8+EGFR     Continue all other maintenance medications.  Follow up plan: Return in about 4 months (around 10/12/2022), or if symptoms worsen or fail to improve.   Continue healthy lifestyle choices, including diet (rich in fruits, vegetables, and lean proteins, and low in salt and simple carbohydrates) and exercise (at least 30 minutes  of moderate physical activity daily).  Educational handout given for DASH diet   The above assessment and management plan was discussed with the  patient. The patient verbalized understanding of and has agreed to the management plan. Patient is aware to call the clinic if they develop any new symptoms or if symptoms persist or worsen. Patient is aware when to return to the clinic for a follow-up visit. Patient educated on when it is appropriate to go to the emergency department.   Monia Pouch, FNP-C [facility] [phone]

## 2022-06-13 LAB — BMP8+EGFR
BUN/Creatinine Ratio: 12 (ref 9–23)
BUN: 12 mg/dL (ref 6–24)
CO2: 25 mmol/L (ref 20–29)
Calcium: 9.6 mg/dL (ref 8.7–10.2)
Chloride: 100 mmol/L (ref 96–106)
Creatinine, Ser: 1.02 mg/dL — ABNORMAL HIGH (ref 0.57–1.00)
Glucose: 84 mg/dL (ref 70–99)
Potassium: 4.1 mmol/L (ref 3.5–5.2)
Sodium: 139 mmol/L (ref 134–144)
eGFR: 70 mL/min/{1.73_m2} (ref >59)

## 2022-07-17 ENCOUNTER — Other Ambulatory Visit (INDEPENDENT_AMBULATORY_CARE_PROVIDER_SITE_OTHER): Payer: Self-pay | Admitting: Nurse Practitioner

## 2022-07-17 DIAGNOSIS — I10 Essential (primary) hypertension: Secondary | ICD-10-CM

## 2022-07-18 ENCOUNTER — Other Ambulatory Visit (INDEPENDENT_AMBULATORY_CARE_PROVIDER_SITE_OTHER): Payer: Self-pay | Admitting: Nurse Practitioner

## 2022-07-18 DIAGNOSIS — I10 Essential (primary) hypertension: Secondary | ICD-10-CM

## 2022-07-20 MED ORDER — LOSARTAN POTASSIUM-HCTZ 100-12.5 MG PO TABS: 1.0000 | ORAL_TABLET | Freq: Every day | ORAL | 3 refills | Status: DC

## 2022-07-31 ENCOUNTER — Encounter
Payer: Worker's Compensation | Attending: Physical Medicine & Rehabilitation | Admitting: Physical Medicine & Rehabilitation

## 2022-07-31 ENCOUNTER — Encounter: Payer: Self-pay | Admitting: Physical Medicine & Rehabilitation

## 2022-07-31 VITALS — BP 145/93 | HR 73 | Ht 67.0 in | Wt 210.0 lb

## 2022-07-31 DIAGNOSIS — M79642 Pain in left hand: Secondary | ICD-10-CM | POA: Diagnosis present

## 2022-07-31 NOTE — Progress Notes (Signed)
EMG/NCV of BUEs performed today to evaluate Left hand numbness.  Please refer to scanned report under media tab.  Printed report will be faxed to Dr Caralyn Guile at [facility] for review.  No follow up appt needed at this office .

## 2022-07-31 NOTE — Patient Instructions (Signed)
EMG/NCV of upper extremities performed today , results will be sent to Dr Caralyn Guile who will review findings

## 2022-09-11 ENCOUNTER — Encounter: Payer: Self-pay | Admitting: Family Medicine

## 2022-09-11 ENCOUNTER — Encounter: Payer: Self-pay | Admitting: Obstetrics & Gynecology

## 2022-11-27 ENCOUNTER — Encounter: Payer: Self-pay | Admitting: Family Medicine

## 2023-02-26 ENCOUNTER — Encounter: Payer: Self-pay | Admitting: Family Medicine

## 2023-03-08 ENCOUNTER — Other Ambulatory Visit: Payer: Self-pay | Admitting: Family Medicine

## 2023-03-08 DIAGNOSIS — I10 Essential (primary) hypertension: Secondary | ICD-10-CM

## 2023-03-08 NOTE — Telephone Encounter (Signed)
Patient must schedule an appointment for [place] to be able to refill.

## 2023-03-11 ENCOUNTER — Encounter: Payer: Self-pay | Admitting: Family Medicine

## 2023-03-11 NOTE — Telephone Encounter (Signed)
LMTCB to schedule appt Letter mailed 

## 2023-03-27 IMAGING — CT CT ABD-PELV W/ CM
2 of 5 series · 17 of 46 positions shown, 19 images · IV contrast (Omnipaque or Isovue)
Comparison: 01/04/2010

CLINICAL DATA: Abdominal abscess or infection suspected. Left lower
abdominal pain at surgical site from recent hysterectomy.

EXAM:
CT ABDOMEN AND PELVIS WITH CONTRAST
TECHNIQUE: Multidetector CT imaging of the abdomen and pelvis was performed
using the standard protocol following bolus administration of
intravenous contrast.
CONTRAST:  100mL OMNIPAQUE IOHEXOL 300 MG/ML  SOLN

[Series 2: axial st · axial · 0.89mm/px · z∈[+952,+1382]mm · 14 of 98 slices shown, 16 images]
[im 6/98  soft-tissue]
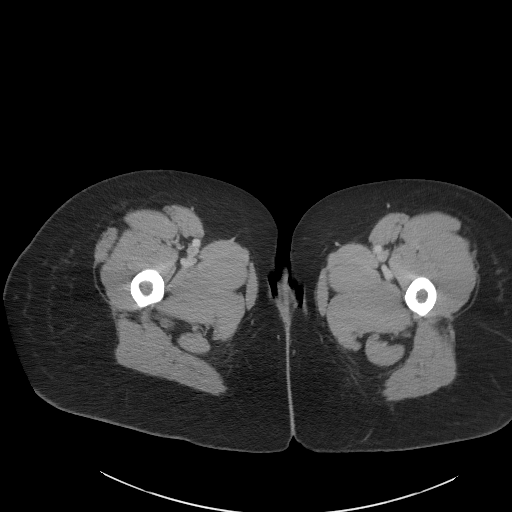
[im 6/98  bone]
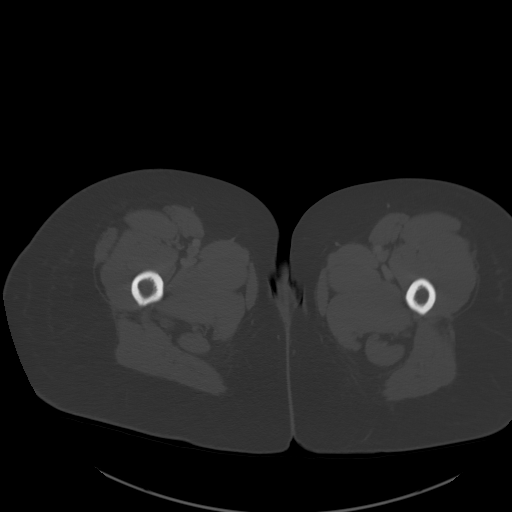
[im 11/98  soft-tissue]
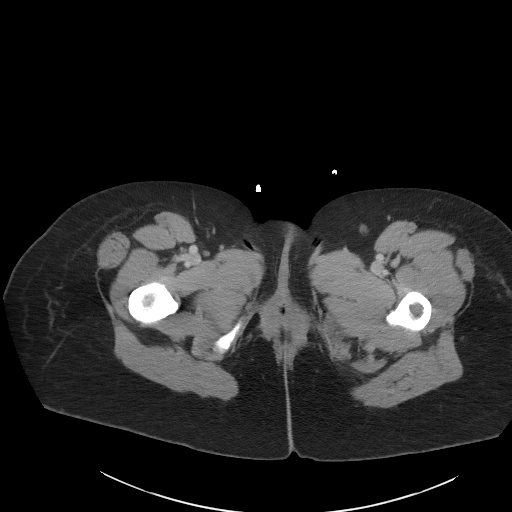
[im 21/98  soft-tissue]
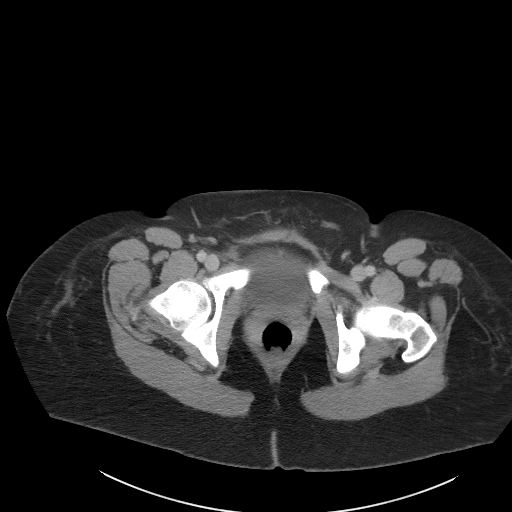
[im 26/98  soft-tissue]
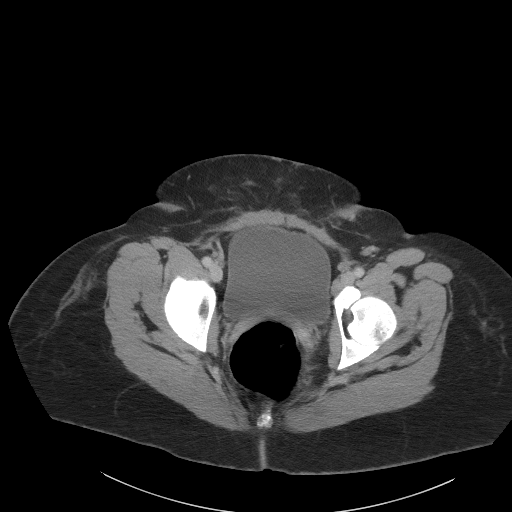
[im 31/98  soft-tissue]
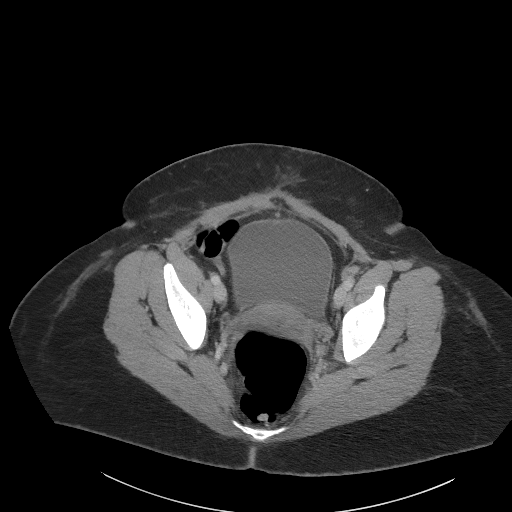
[im 41/98  soft-tissue]
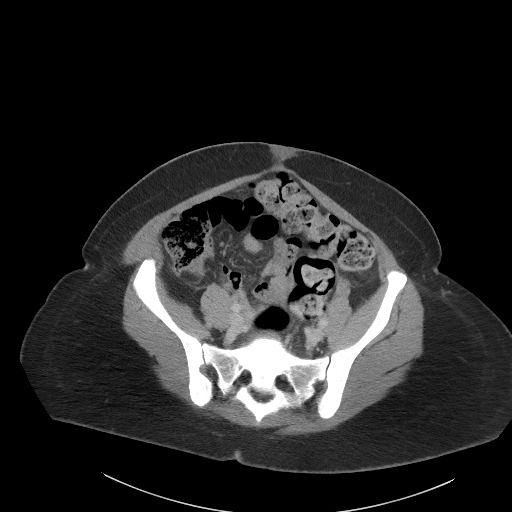
[im 46/98  soft-tissue]
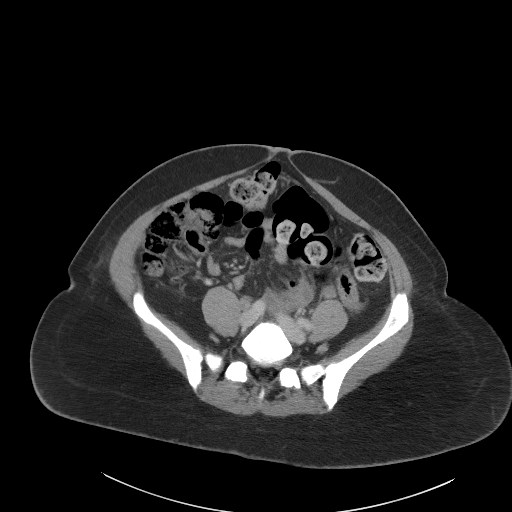
[im 52/98  soft-tissue]
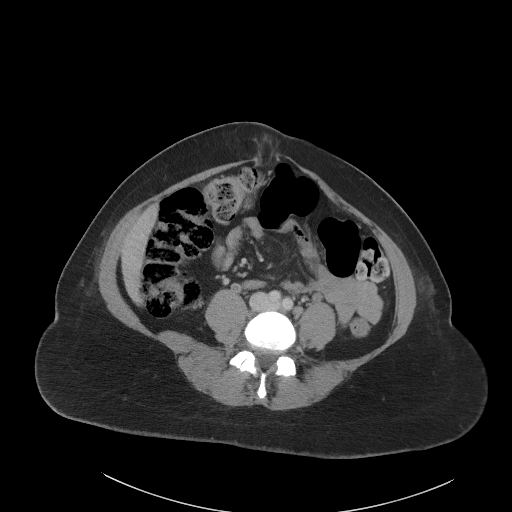
[im 57/98  soft-tissue]
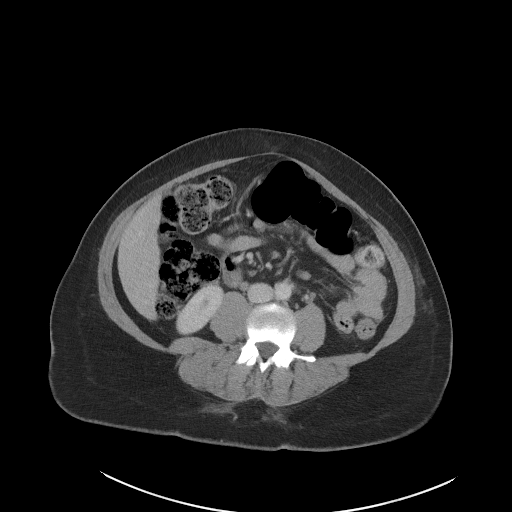
[im 57/98  bone]
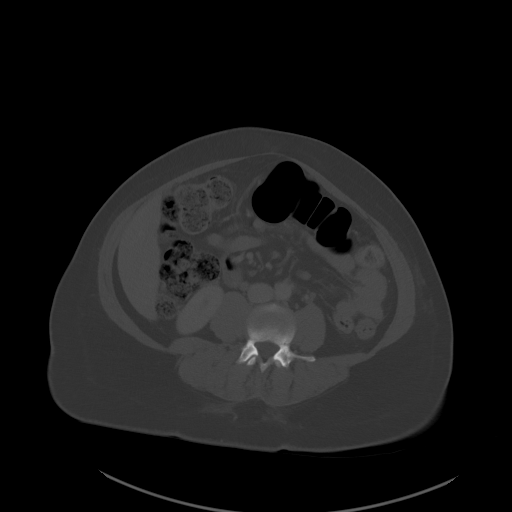
[im 67/98  soft-tissue]
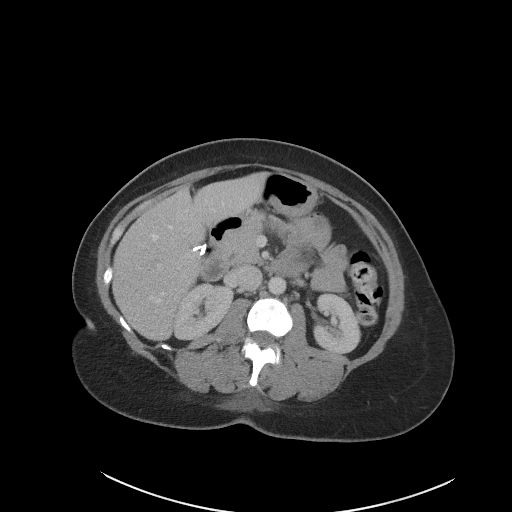
[im 72/98  soft-tissue]
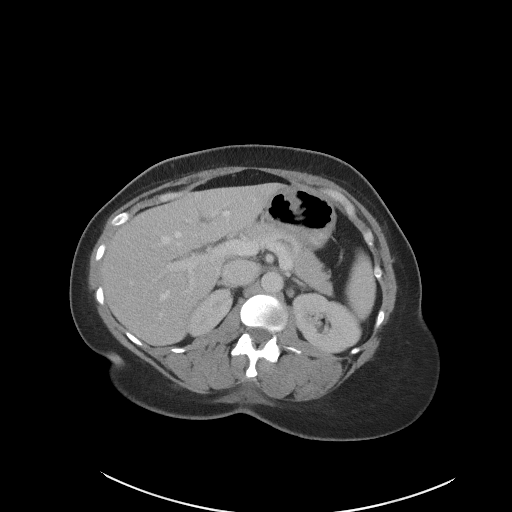
[im 77/98  soft-tissue]
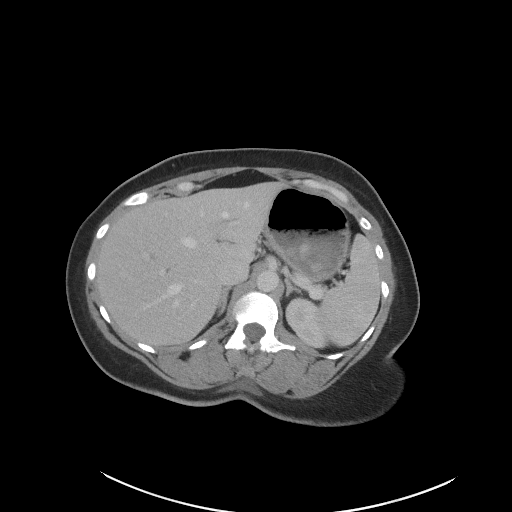
[im 87/98  soft-tissue]
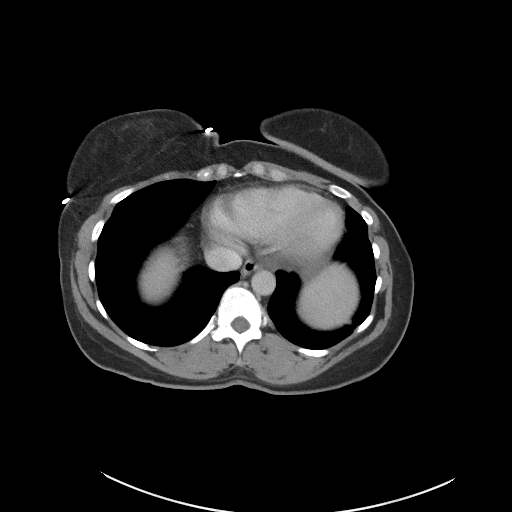
[im 92/98  soft-tissue]
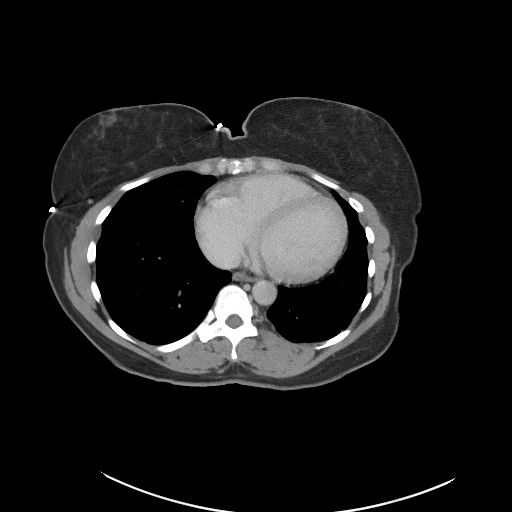

[Series 5: coronal st · coronal · 0.87mm/px · 3 of 117 slices shown]
[im 39/117  soft-tissue]
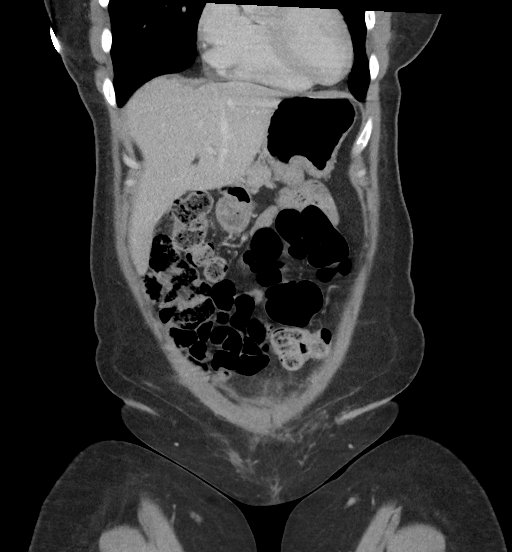
[im 52/117  soft-tissue]
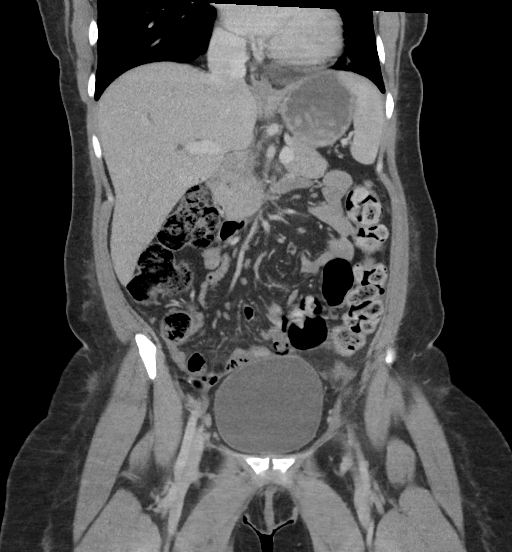
[im 65/117  soft-tissue]
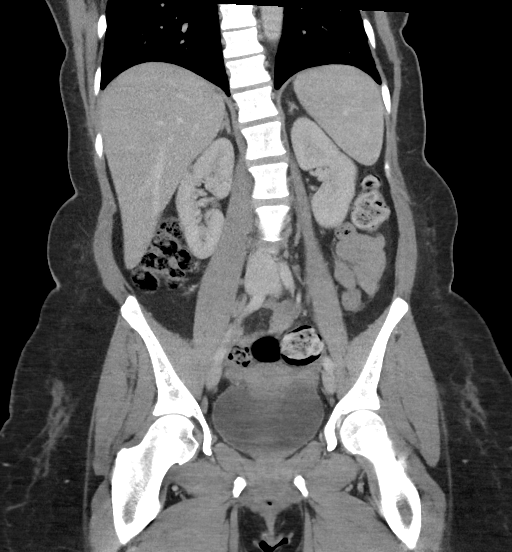

[17 of 46 positions shown; findings below may reference images not displayed]

FINDINGS: Lower chest:  No contributory findings.

Hepatobiliary: No focal liver abnormality.Cholecystectomy.

Pancreas: Unremarkable.

Spleen: Unremarkable.

Adrenals/Urinary Tract: Negative adrenals. No hydronephrosis or
stone. Unremarkable bladder.

Stomach/Bowel: Diffuse colonic gas and stool with formed stool
greatest at the sigmoid. No obstruction. No bowel wall thickening or
other visible inflammation.

Vascular/Lymphatic: No acute vascular abnormality. No mass or
adenopathy.

Reproductive:Hysterectomy. Small volume fluid in the pelvis which is
expected after recent surgery.

Other: Unremarkable ventral abdominal wall incision. Fatty
periumbilical hernia and true umbilical hernia.

Musculoskeletal: No acute abnormalities.  Mild scoliosis.
IMPRESSION: 1. No unexpected finding related to recent hysterectomy. Negative
for abscess.
2. Diffuse stool and gas distended colon, please correlate for
constipation.
3. Fatty umbilical and periumbilical hernias.

## 2023-05-23 ENCOUNTER — Encounter: Payer: Self-pay | Admitting: Family Medicine

## 2023-05-23 ENCOUNTER — Ambulatory Visit: Payer: Self-pay | Admitting: Family Medicine

## 2023-05-28 ENCOUNTER — Encounter: Payer: Self-pay | Admitting: Family Medicine

## 2023-05-28 ENCOUNTER — Ambulatory Visit: Payer: BC Managed Care – PPO | Admitting: Family Medicine

## 2023-05-28 VITALS — BP 122/87 | HR 54 | Temp 97.3°F | Resp 20 | Ht 67.0 in | Wt 228.5 lb

## 2023-05-28 DIAGNOSIS — M7989 Other specified soft tissue disorders: Secondary | ICD-10-CM

## 2023-05-28 DIAGNOSIS — E559 Vitamin D deficiency, unspecified: Secondary | ICD-10-CM

## 2023-05-28 DIAGNOSIS — E876 Hypokalemia: Secondary | ICD-10-CM | POA: Diagnosis not present

## 2023-05-28 DIAGNOSIS — D500 Iron deficiency anemia secondary to blood loss (chronic): Secondary | ICD-10-CM

## 2023-05-28 DIAGNOSIS — I10 Essential (primary) hypertension: Secondary | ICD-10-CM | POA: Diagnosis not present

## 2023-05-28 LAB — LIPID PANEL

## 2023-05-28 LAB — ANEMIA PROFILE B
Basophils Absolute: 0 10*3/uL (ref 0.0–0.2)
Basos: 1 %
Lymphocytes Absolute: 1.6 10*3/uL (ref 0.7–3.1)
Lymphs: 24 %
MCH: 26.1 pg — ABNORMAL LOW (ref 26.6–33.0)
RDW: 13.2 % (ref 11.7–15.4)
WBC: 6.6 10*3/uL (ref 3.4–10.8)

## 2023-05-28 LAB — CMP14+EGFR
Albumin: 4.3 g/dL (ref 3.9–4.9)
CO2: 26 mmol/L (ref 20–29)
Chloride: 98 mmol/L (ref 96–106)

## 2023-05-28 LAB — THYROID PANEL WITH TSH

## 2023-05-28 MED ORDER — LOSARTAN POTASSIUM-HCTZ 100-25 MG PO TABS: 1.0000 | ORAL_TABLET | Freq: Every day | ORAL | 1 refills | Status: DC

## 2023-05-28 MED ORDER — AMLODIPINE BESYLATE 5 MG PO TABS: 5.0000 mg | ORAL_TABLET | Freq: Every day | ORAL | 1 refills | Status: DC

## 2023-05-28 NOTE — Patient Instructions (Signed)

## 2023-05-28 NOTE — Progress Notes (Signed)
Subjective:  Patient ID: Ashley [name], female    DOB: 09-17-1976, 47 y.o.   MRN: [id number]  Patient Care Team: Sonny Masters, FNP as PCP - General (Family Medicine)   Chief Complaint:  Medical Management of Chronic Issues   HPI: Ashley [name] is a 47 y.o. female presenting on 05/28/2023 for Medical Management of Chronic Issues   HPI 1. Primary hypertension Complaint with meds - No Current Medications - norvasc and hyzaar Checking BP at home - not checking at home Exercising Regularly - No Watching Salt intake - Yes Pertinent ROS:  Headache - No Fatigue - No Visual Disturbances - No Chest pain - No Dyspnea - No Palpitations - No LE edema - Yes secondary to Norvasc They report good compliance with medications and can restate their regimen  BP Readings from Last 3 Encounters:  05/28/23 122/87  07/31/22 (!) 145/93  06/12/22 132/78     2. Anemia due to chronic blood loss Better now that Hysterectectomy in 2023 denies dizziness or lighheadedness. No vaginal bleeding denies bloody or black stools. Denies eating clay or laundry starch, denies mouth/tongue lesions. Denies burning of tongue or difficulty swallowing. Denies parathesia or numbness. Denies abnormal BMs or steatorhea, Denies cold intolerance, Denies dark urine.  3. Hypokalemia Denies cramping weakness or fatigue. Denies itachycardia. Positive BLLE edema secondary to norvasc.  Relevant past medical, surgical, family, and social history reviewed and updated as indicated.  Allergies and medications reviewed and updated. Data reviewed: Chart in Epic.   Past Medical History:  Diagnosis Date   Hernia of abdominal cavity    Hypertension     Past Surgical History:  Procedure Laterality Date   CERVICAL CONIZATION W/BX N/A 09/06/2021   Procedure: ENDOCERVICAL CONIZATION;  Surgeon: Lazaro Arms, MD;  Location: [facility];  Service: Gynecology;  Laterality: N/A;   CHOLECYSTECTOMY  2011   HERNIA REPAIR      umbilical hernia   SALPINGOOPHORECTOMY Right 09/06/2021   Procedure: OPEN RIGHT SALPINGO OOPHORECTOMY;  Surgeon: Lazaro Arms, MD;  Location: [facility];  Service: Gynecology;  Laterality: Right;   SUPRACERVICAL ABDOMINAL HYSTERECTOMY N/A 09/06/2021   Procedure: HYSTERECTOMY SUPRACERVICAL ABDOMINAL;  Surgeon: Lazaro Arms, MD;  Location: [facility];  Service: Gynecology;  Laterality: N/A;   TUBAL LIGATION  2009   UNILATERAL SALPINGECTOMY Left 09/06/2021   Procedure: LEFT SALPINGECTOMY;  Surgeon: Lazaro Arms, MD;  Location: [facility];  Service: Gynecology;  Laterality: Left;    Social History   Socioeconomic History   Marital status: Married    Spouse name: Ashley [name]   Number of children: 3   Years of education: Not on file   Highest education level: Not on file  Occupational History   Not on file  Tobacco Use   Smoking status: Never   Smokeless tobacco: Never  Vaping Use   Vaping status: Never Used  Substance and Sexual Activity   Alcohol use: Not Currently   Drug use: Never   Sexual activity: Not Currently    Birth control/protection: Surgical    Comment: tubal/hyst  Other Topics Concern   Not on file  Social History Narrative   Married since June 2004.Lives with husband and 3 girls.Works as Stage manager.   Social Determinants of Health   Financial Resource Strain: Medium Risk (05/16/2021)   Overall Financial Resource Strain (CARDIA)    Difficulty of Paying Living Expenses: Somewhat hard  Food Insecurity: No Food Insecurity (05/16/2021)   Hunger Vital Sign  Worried About Programme researcher, broadcasting/film/video in the Last Year: Never true    Ran Out of Food in the Last Year: Never true  Transportation Needs: No Transportation Needs (05/16/2021)   PRAPARE - Administrator, Civil Service (Medical): No    Lack of Transportation (Non-Medical): No  Physical Activity: Inactive (05/16/2021)   Exercise Vital Sign    Days of Exercise per Week: 0 days    Minutes of Exercise per Session: 0  min  Stress: No Stress Concern Present (05/16/2021)   [employer] of Occupational Health - Occupational Stress Questionnaire    Feeling of Stress : Not at all  Social Connections: Unknown (03/14/2022)   Received from [facility], [facility]   Social Network    Social Network: Not on file  Intimate Partner Violence: Unknown (02/16/2022)   Received from [facility], [facility]   HITS    Physically Hurt: Not on file    Insult or Talk Down To: Not on file    Threaten Physical Harm: Not on file    Scream or Curse: Not on file    Outpatient Encounter Medications as of 05/28/2023  Medication Sig   amLODipine (NORVASC) 5 MG tablet Take 1 tablet (5 mg total) by mouth daily.   losartan-hydrochlorothiazide (HYZAAR) 100-25 MG tablet Take 1 tablet by mouth daily.   potassium chloride SA (KLOR-CON M) 20 MEQ tablet Take 2 tablets (40 mEq total) by mouth daily.   [DISCONTINUED] amLODipine (NORVASC) 10 MG tablet Take 1 tablet (10 mg total) by mouth daily. For high blood pressure   [DISCONTINUED] losartan-hydrochlorothiazide (HYZAAR) 100-12.5 MG tablet Take 1 tablet by mouth daily.   [DISCONTINUED] gabapentin (NEURONTIN) 100 MG capsule Take 1 capsule (100 mg total) by mouth at bedtime. (Patient not taking: Reported on 05/28/2023)   [DISCONTINUED] ketorolac (TORADOL) 10 MG tablet Take 1 tablet by mouth every 8 (eight) hours as needed.   No facility-administered encounter medications on file as of 05/28/2023.    No Known Allergies  Review of Systems  Constitutional: Negative.  Negative for activity change, appetite change, chills, fatigue and fever.  HENT: Negative.    Eyes: Negative.   Respiratory: Negative.  Negative for cough, chest tightness and shortness of breath.   Cardiovascular: Negative.  Negative for chest pain, palpitations and leg swelling.  Gastrointestinal: Negative.  Negative for blood in stool, constipation, diarrhea, nausea and vomiting.  Endocrine: Negative.    Genitourinary: Negative.  Negative for dysuria, frequency and urgency.  Musculoskeletal: Negative.  Negative for arthralgias and myalgias.  Skin: Negative.   Allergic/Immunologic: Negative.   Neurological:  Positive for numbness (Left hand numbness secondary to trauma April 2022). Negative for dizziness and headaches.  Hematological: Negative.   Psychiatric/Behavioral:  Negative for confusion, hallucinations, self-injury, sleep disturbance and suicidal ideas.   All other systems reviewed and are negative.       Objective:  BP 122/87   Pulse (!) 54   Temp (!) 97.3 F (36.3 C) (Oral)   Resp 20   Ht 5\' 7"  (1.702 m)   Wt 228 lb 8 oz (103.6 kg)   LMP 08/14/2021   SpO2 98%   BMI 35.79 kg/m    Wt Readings from Last 3 Encounters:  05/28/23 228 lb 8 oz (103.6 kg)  07/31/22 210 lb (95.3 kg)  06/12/22 210 lb (95.3 kg)    Physical Exam Vitals and nursing note reviewed.  Constitutional:      Appearance: Normal appearance. She is well-developed, well-groomed  and overweight.  HENT:     Head: Normocephalic and atraumatic.     Nose: Nose normal.     Mouth/Throat:     Lips: Pink.     Mouth: Mucous membranes are moist. No oral lesions.  Eyes:     Conjunctiva/sclera: Conjunctivae normal.     Pupils: Pupils are equal, round, and reactive to light.  Cardiovascular:     Rate and Rhythm: Normal rate and regular rhythm.     Pulses: Normal pulses.     Heart sounds: Normal heart sounds.  Pulmonary:     Effort: Pulmonary effort is normal.     Breath sounds: Normal breath sounds.  Musculoskeletal:     Cervical back: Normal range of motion and neck supple.     Right lower leg: Edema (trace) present.     Left lower leg: Edema (trace) present.     Comments: Brace on left hand  Skin:    General: Skin is warm.     Capillary Refill: Capillary refill takes less than 2 seconds.  Neurological:     General: No focal deficit present.     Mental Status: She is alert and oriented to person,  place, and time.  Psychiatric:        Mood and Affect: Mood normal.        Behavior: Behavior normal.        Thought Content: Thought content normal.        Judgment: Judgment normal.     Results for orders placed or performed in visit on 06/12/22  BMP8+EGFR  Result Value Ref Range   Glucose 84 70 - 99 mg/dL   BUN 12 6 - 24 mg/dL   Creatinine, Ser 7.82 (H) 0.57 - 1.00 mg/dL   eGFR 70 >95 AO/ZHY/8.65   BUN/Creatinine Ratio 12 9 - 23   Sodium 139 134 - 144 mmol/L   Potassium 4.1 3.5 - 5.2 mmol/L   Chloride 100 96 - 106 mmol/L   CO2 25 20 - 29 mmol/L   Calcium 9.6 8.7 - 10.2 mg/dL       Pertinent labs & imaging results that were available during my care of the patient were reviewed by me and considered in my medical decision making.  Assessment & Plan:  Ashley [name] was seen today for medical management of chronic issues.  Diagnoses and all orders for this visit:  Primary hypertension -     Anemia Profile B -     CMP14+EGFR -     Thyroid Panel With TSH -     Lipid panel -     VITAMIN D 25 Hydroxy (Vit-D Deficiency, Fractures) -     losartan-hydrochlorothiazide (HYZAAR) 100-25 MG tablet; Take 1 tablet by mouth daily. -     amLODipine (NORVASC) 5 MG tablet; Take 1 tablet (5 mg total) by mouth daily. BP not controlled. Changes were made in regimen today. Goal BP is 130/80. Pt aware to report any persistent high or low readings. DASH diet and exercise encouraged. Exercise at least 150 minutes per week and increase as tolerated. Goal BMI > 25. Stress management encouraged. Avoid nicotine and tobacco product use. Avoid excessive alcohol and NSAID's. Avoid more than 2000 mg of sodium daily. Medications as prescribed. Follow up as scheduled.    Anemia due to chronic blood loss Will recheck labs today.  -     Anemia Profile B  Hypokalemia -     CMP14+EGFR -      continue taking  posasium prescription as directed.  -      Educated on S&S of low and high postassium and need to stay  hydrated  Vitamin D deficiency Labs pending. Continue repletion therapy. If indicated, will change repletion dosage. Eat foods rich in Vit D including milk, orange juice, yogurt with vitamin D added, salmon or mackerel, canned tuna fish, cereals with vitamin D added, and cod liver oil. Get out in the sun but make sure to wear at least SPF 30 sunscreen.  -     CMP14+EGFR -     VITAMIN D 25 Hydroxy (Vit-D Deficiency, Fractures)  Localized swelling of lower extremity Decreased amlodipine to 5 mg and increased hydrochlorothiazide dose in Hyzaar to 25 mg. Follow up in 6 weeks for repeat BP and BMP. -     Anemia Profile B -     CMP14+EGFR -     losartan-hydrochlorothiazide (HYZAAR) 100-25 MG tablet; Take 1 tablet by mouth daily. -     amLODipine (NORVASC) 5 MG tablet; Take 1 tablet (5 mg total) by mouth daily. -     Brain natriuretic peptide   Continue all other maintenance medications.  Follow up plan: Return in 6 weeks (on 07/09/2023), or if symptoms worsen or fail to improve, for BP.   Continue healthy lifestyle choices, including diet (rich in fruits, vegetables, and lean proteins, and low in salt and simple carbohydrates) and exercise (at least 30 minutes of moderate physical activity daily).  Educational handout given for dash diet and HTN  The above assessment and management plan was discussed with the patient. The patient verbalized understanding of and has agreed to the management plan. Patient is aware to call the clinic if they develop any new symptoms or if symptoms persist or worsen. Patient is aware when to return to the clinic for a follow-up visit. Patient educated on when it is appropriate to go to the emergency department.   Maryelizabeth Kaufmann NP student [facility] [phone]  I personally was present during the history, physical exam, and medical decision-making activities of this visit and have verified that the services and findings are accurately  documented in the nurse practitioner student's note.  Kari Baars, FNP-C [facility] [address] [place], [place] [address] [phone]

## 2023-05-29 ENCOUNTER — Encounter: Payer: Self-pay | Admitting: Family Medicine

## 2023-05-29 LAB — CMP14+EGFR
ALT: 25 IU/L (ref 0–32)
AST: 30 IU/L (ref 0–40)
Alkaline Phosphatase: 90 IU/L (ref 44–121)
BUN/Creatinine Ratio: 13 (ref 9–23)
BUN: 14 mg/dL (ref 6–24)
Bilirubin Total: 0.5 mg/dL (ref 0.0–1.2)
Calcium: 9.9 mg/dL (ref 8.7–10.2)
Creatinine, Ser: 1.12 mg/dL — ABNORMAL HIGH (ref 0.57–1.00)
Globulin, Total: 3.3 g/dL (ref 1.5–4.5)
Glucose: 109 mg/dL — ABNORMAL HIGH (ref 70–99)
Potassium: 3.8 mmol/L (ref 3.5–5.2)
Sodium: 137 mmol/L (ref 134–144)
Total Protein: 7.6 g/dL (ref 6.0–8.5)
eGFR: 62 mL/min/{1.73_m2} (ref >59)

## 2023-05-29 LAB — ANEMIA PROFILE B
EOS (ABSOLUTE): 0.1 10*3/uL (ref 0.0–0.4)
Eos: 1 %
Ferritin: 51 ng/mL (ref 15–150)
Folate: 6.2 ng/mL (ref >3.0)
Hematocrit: 38.7 % (ref 34.0–46.6)
Hemoglobin: 12 g/dL (ref 11.1–15.9)
Immature Grans (Abs): 0 10*3/uL (ref 0.0–0.1)
Immature Granulocytes: 0 %
Iron Saturation: 13 % — ABNORMAL LOW (ref 15–55)
Iron: 39 ug/dL (ref 27–159)
MCHC: 31 g/dL — ABNORMAL LOW (ref 31.5–35.7)
MCV: 84 fL (ref 79–97)
Monocytes Absolute: 0.4 10*3/uL (ref 0.1–0.9)
Monocytes: 7 %
Neutrophils Absolute: 4.4 10*3/uL (ref 1.4–7.0)
Neutrophils: 67 %
Platelets: 317 10*3/uL (ref 150–450)
RBC: 4.59 x10E6/uL (ref 3.77–5.28)
Retic Ct Pct: 1.9 % (ref 0.6–2.6)
Total Iron Binding Capacity: 309 ug/dL (ref 250–450)
UIBC: 270 ug/dL (ref 131–425)
Vitamin B-12: 428 pg/mL (ref 232–1245)

## 2023-05-29 LAB — LIPID PANEL
Chol/HDL Ratio: 4.2 ratio (ref 0.0–4.4)
Cholesterol, Total: 168 mg/dL (ref 100–199)
HDL: 40 mg/dL (ref >39)
Triglycerides: 71 mg/dL (ref 0–149)
VLDL Cholesterol Cal: 14 mg/dL (ref 5–40)

## 2023-05-29 LAB — VITAMIN D 25 HYDROXY (VIT D DEFICIENCY, FRACTURES): Vit D, 25-Hydroxy: 28 ng/mL — ABNORMAL LOW (ref 30.0–100.0)

## 2023-05-29 LAB — THYROID PANEL WITH TSH
T3 Uptake Ratio: 24 % (ref 24–39)
T4, Total: 6.6 ug/dL (ref 4.5–12.0)
TSH: 2.74 u[IU]/mL (ref 0.450–4.500)

## 2023-05-29 LAB — BRAIN NATRIURETIC PEPTIDE: BNP: <2.5 pg/mL (ref 0.0–100.0)

## 2023-07-05 ENCOUNTER — Ambulatory Visit: Payer: BC Managed Care – PPO | Admitting: Family Medicine

## 2023-07-12 ENCOUNTER — Ambulatory Visit: Payer: BC Managed Care – PPO | Admitting: Family Medicine

## 2023-07-12 ENCOUNTER — Encounter: Payer: Self-pay | Admitting: Family Medicine

## 2023-07-12 VITALS — BP 120/62 | HR 51 | Temp 97.5°F | Ht 67.0 in | Wt 231.6 lb

## 2023-07-12 DIAGNOSIS — I10 Essential (primary) hypertension: Secondary | ICD-10-CM

## 2023-07-12 IMAGING — US US EXTREM UP*L* LTD
1 series · 12 of 12 positions shown · non-contrast
Comparison: None.

CLINICAL DATA: Left shoulder palpable mass increasing in size over
the past 1 year. Tender.

EXAM:
ULTRASOUND LEFT UPPER EXTREMITY LIMITED
TECHNIQUE: Ultrasound examination of the upper extremity soft tissues was
performed in the area of clinical concern.

[Series 1: us soft tissue upper extremity limited left (non-v · 12 acquisitions, 12 frames shown]
[im 1/12]
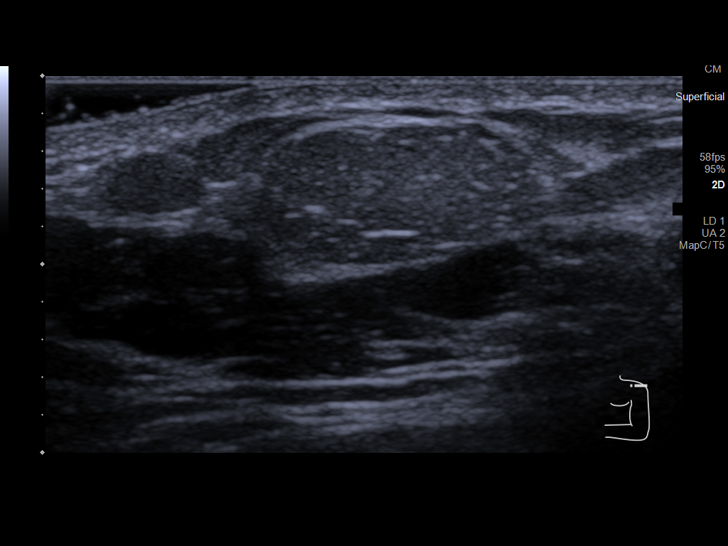
[im 2/12]
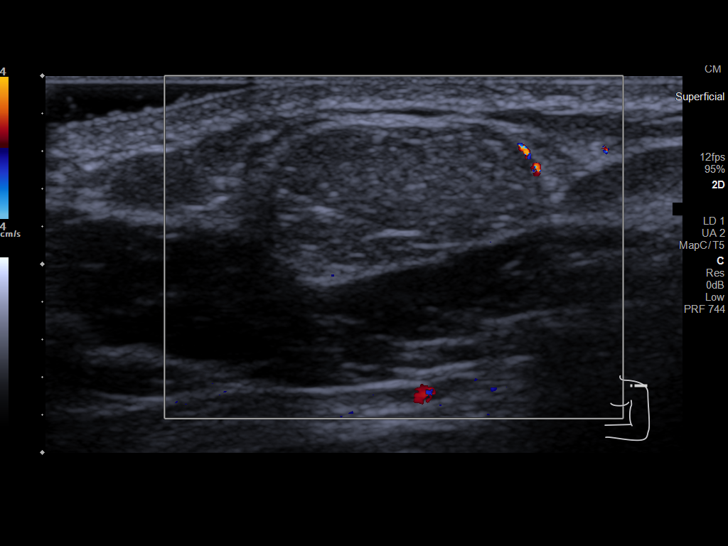
[im 3/12]
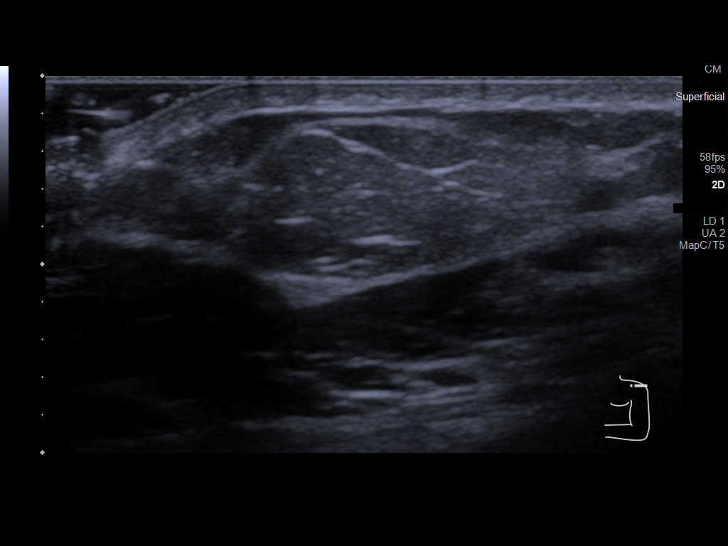
[im 4/12]
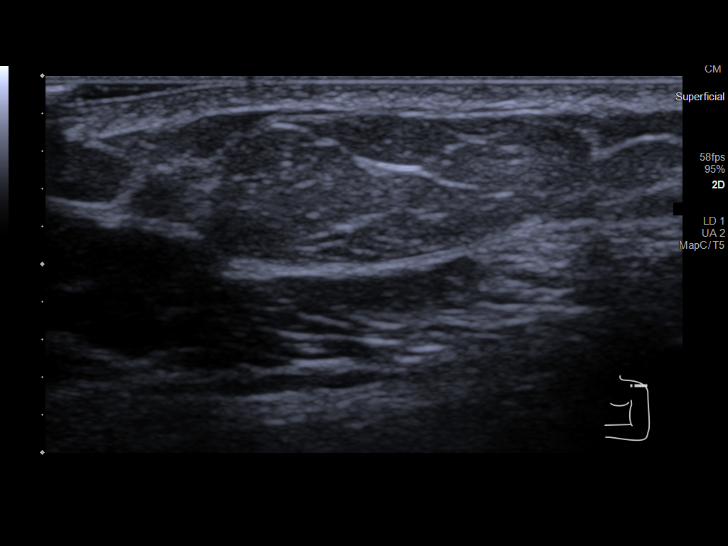
[im 5/12]
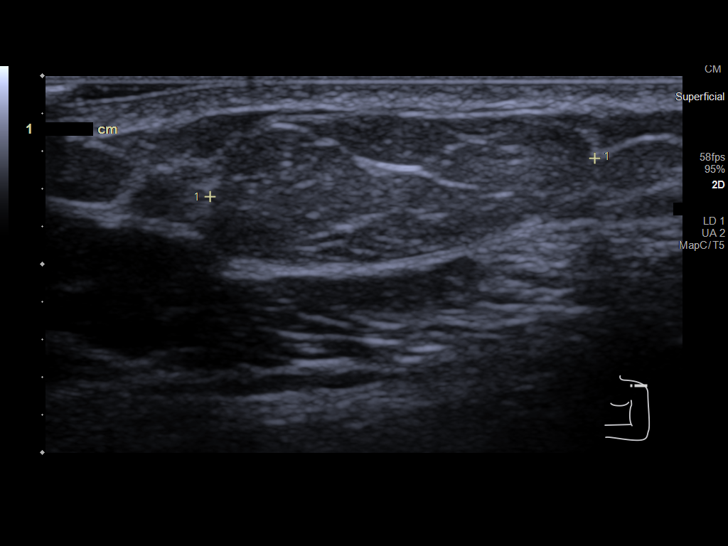
[im 6/12]
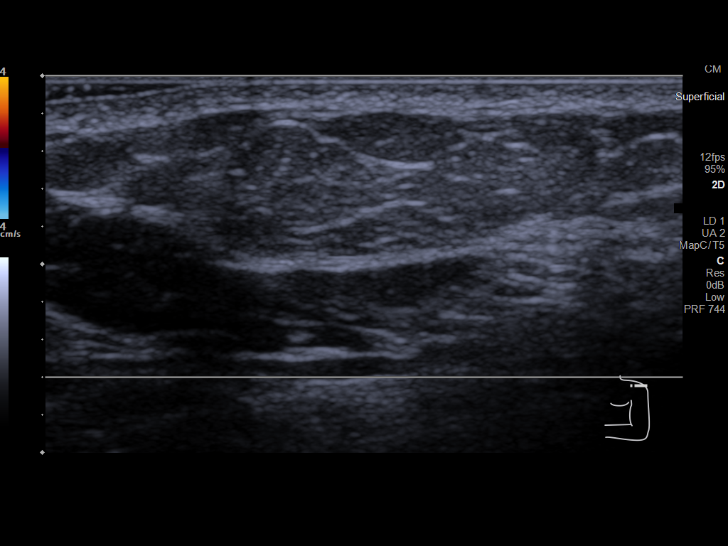
[im 7/12]
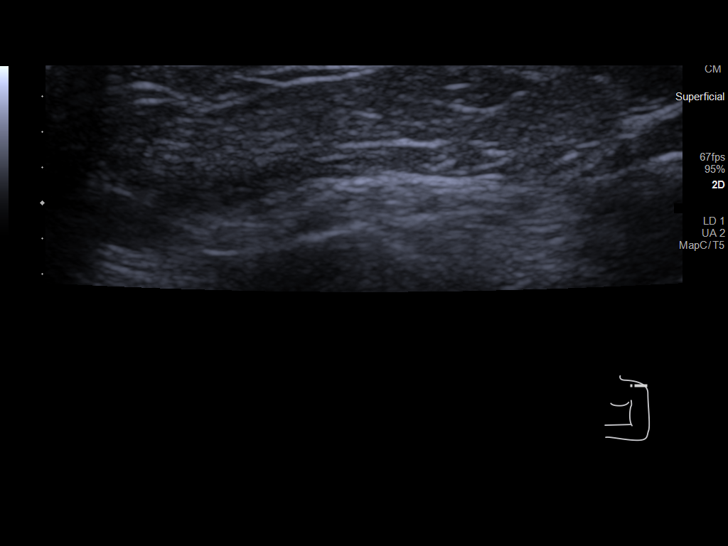
[im 8/12]
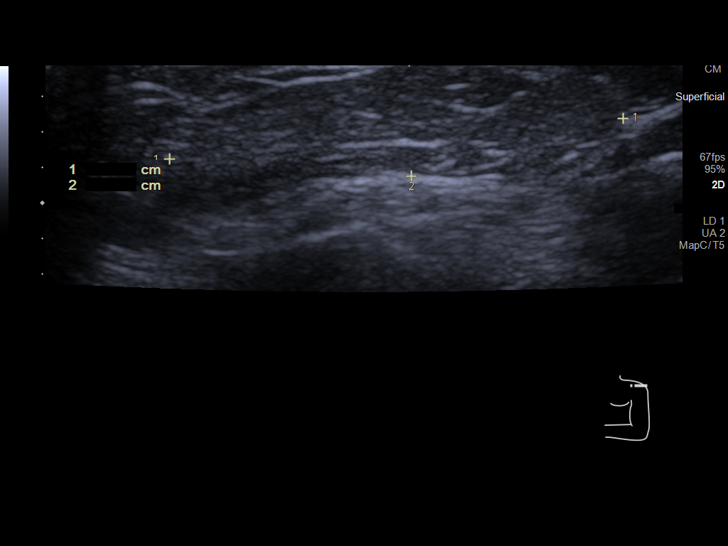
[im 9/12]
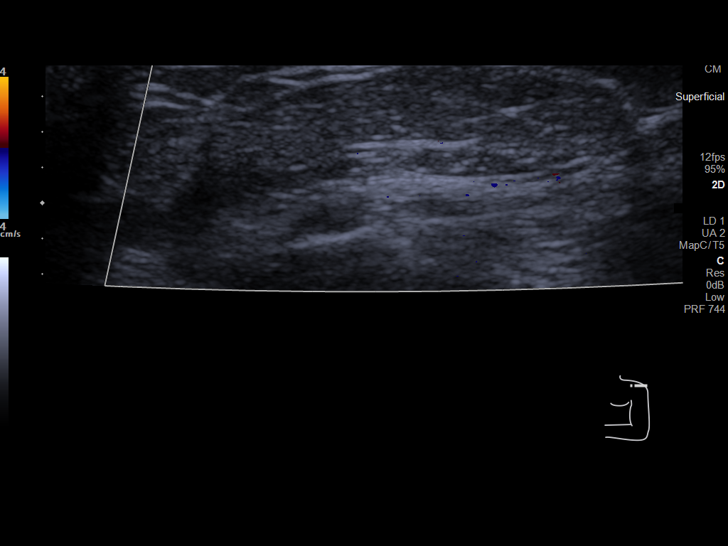
[im 10/12]
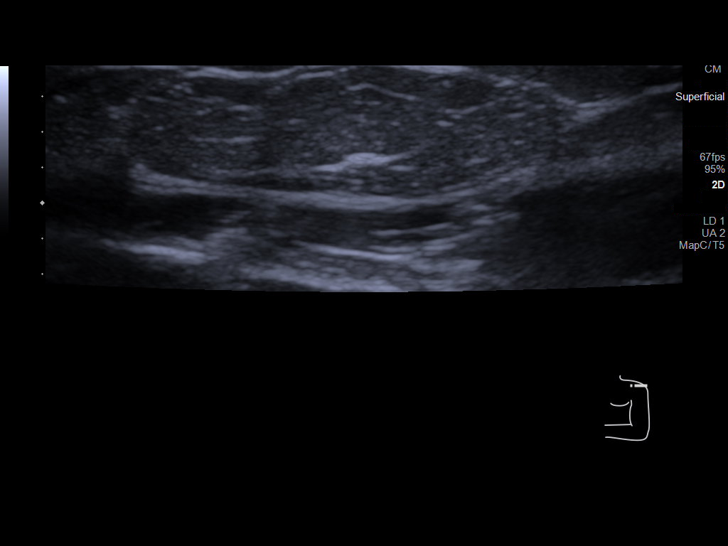
[im 11/12]
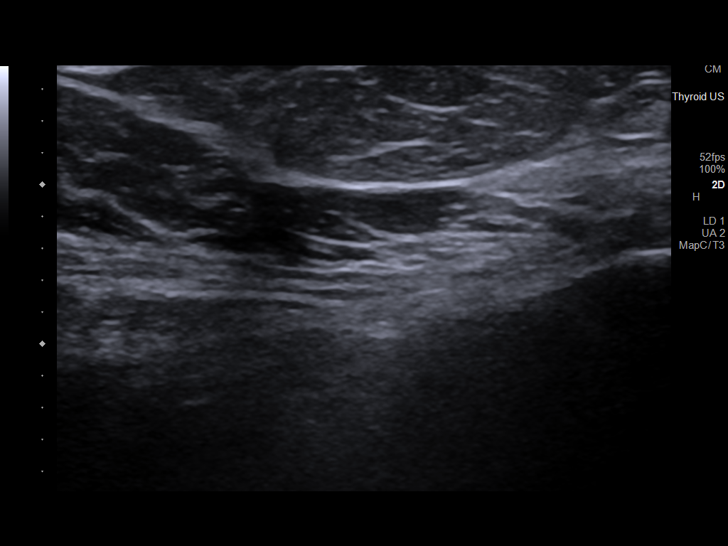
[im 12/12]
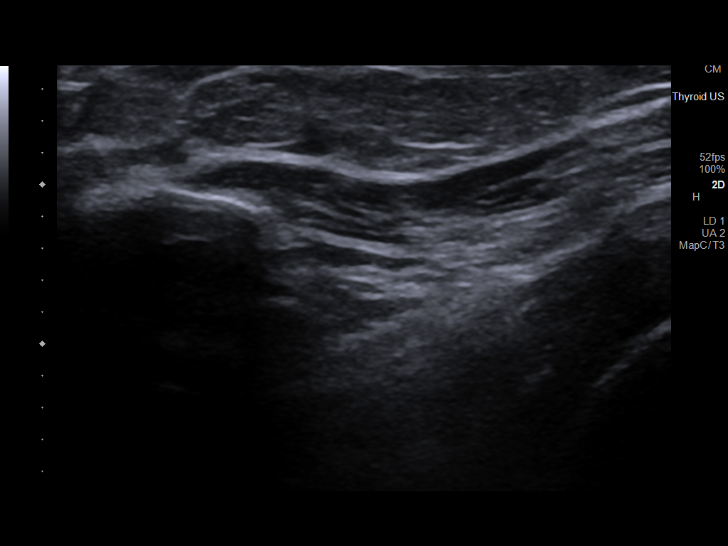

[12 of 12 positions shown; findings below may reference images not displayed]

FINDINGS: Injury images were performed of the area of interest of the anterior
superior left shoulder. In this region there is a well-circumscribed
oval mass demonstrating intermediate echogenicity, smooth borders,
and without internal color flow vascularity. This measures up to
x 0.7 x 2.1 cm.
IMPRESSION: Patient's left shoulder palpable abnormality corresponds to a mass
with narrow zone of transition and echogenicity similar to
surrounding fat. This remains nonspecific, but is compatible with a
benign lipoma.

## 2023-07-12 NOTE — Patient Instructions (Signed)

## 2023-07-12 NOTE — Progress Notes (Signed)
Subjective:  Patient ID: Ashley [name], female    DOB: 11-14-1975, 47 y.o.   MRN: [id number]  Patient Care Team: Sonny Masters, FNP as PCP - General (Family Medicine)   Chief Complaint:  Blood Pressure Check (6 week follow up )   HPI: Ashley [name] is a 47 y.o. female presenting on 07/12/2023 for Blood Pressure Check (6 week follow up )   1. Primary hypertension At last visit pt was having swelling in her legs so amlodipine dosing was decreased and hydrochlorothiazide was added. She states she has done well with this adjustment and leg swelling has subsided. States her blood pressure has been well controlled at home. No headaches, chest pain, weakness, confusion, or palpitations.      Relevant past medical, surgical, family, and social history reviewed and updated as indicated.  Allergies and medications reviewed and updated. Data reviewed: Chart in Epic.   Past Medical History:  Diagnosis Date   Hernia of abdominal cavity    Hypertension     Past Surgical History:  Procedure Laterality Date   CERVICAL CONIZATION W/BX N/A 09/06/2021   Procedure: ENDOCERVICAL CONIZATION;  Surgeon: Lazaro Arms, MD;  Location: [facility];  Service: Gynecology;  Laterality: N/A;   CHOLECYSTECTOMY  2011   HERNIA REPAIR     umbilical hernia   SALPINGOOPHORECTOMY Right 09/06/2021   Procedure: OPEN RIGHT SALPINGO OOPHORECTOMY;  Surgeon: Lazaro Arms, MD;  Location: [facility];  Service: Gynecology;  Laterality: Right;   SUPRACERVICAL ABDOMINAL HYSTERECTOMY N/A 09/06/2021   Procedure: HYSTERECTOMY SUPRACERVICAL ABDOMINAL;  Surgeon: Lazaro Arms, MD;  Location: [facility];  Service: Gynecology;  Laterality: N/A;   TUBAL LIGATION  2009   UNILATERAL SALPINGECTOMY Left 09/06/2021   Procedure: LEFT SALPINGECTOMY;  Surgeon: Lazaro Arms, MD;  Location: [facility];  Service: Gynecology;  Laterality: Left;    Social History   Socioeconomic History   Marital status: Married    Spouse name: Minerva Areola    Number of children: 3   Years of education: Not on file   Highest education level: Not on file  Occupational History   Not on file  Tobacco Use   Smoking status: Never   Smokeless tobacco: Never  Vaping Use   Vaping status: Never Used  Substance and Sexual Activity   Alcohol use: Not Currently   Drug use: Never   Sexual activity: Not Currently    Birth control/protection: Surgical    Comment: tubal/hyst  Other Topics Concern   Not on file  Social History Narrative   Married since June 2004.Lives with husband and 3 girls.Works as Stage manager.   Social Determinants of Health   Financial Resource Strain: Medium Risk (05/16/2021)   Overall Financial Resource Strain (CARDIA)    Difficulty of Paying Living Expenses: Somewhat hard  Food Insecurity: No Food Insecurity (05/16/2021)   Hunger Vital Sign    Worried About Running Out of Food in the Last Year: Never true    Ran Out of Food in the Last Year: Never true  Transportation Needs: No Transportation Needs (05/16/2021)   PRAPARE - Administrator, Civil Service (Medical): No    Lack of Transportation (Non-Medical): No  Physical Activity: Inactive (05/16/2021)   Exercise Vital Sign    Days of Exercise per Week: 0 days    Minutes of Exercise per Session: 0 min  Stress: No Stress Concern Present (05/16/2021)   [employer] of Occupational Health - Occupational Stress Questionnaire  Feeling of Stress : Not at all  Social Connections: Unknown (03/14/2022)   Received from [facility]   Social Network    Social Network: Not on file  Intimate Partner Violence: Unknown (02/16/2022)   Received from [facility], [facility]   HITS    Physically Hurt: Not on file    Insult or Talk Down To: Not on file    Threaten Physical Harm: Not on file    Scream or Curse: Not on file    Outpatient Encounter Medications as of 07/12/2023  Medication Sig   amLODipine (NORVASC) 5 MG tablet Take 1 tablet (5 mg  total) by mouth daily.   losartan-hydrochlorothiazide (HYZAAR) 100-25 MG tablet Take 1 tablet by mouth daily.   potassium chloride SA (KLOR-CON M) 20 MEQ tablet Take 2 tablets (40 mEq total) by mouth daily.   No facility-administered encounter medications on file as of 07/12/2023.    No Known Allergies  Review of Systems  Constitutional:  Negative for activity change, appetite change, chills, diaphoresis, fatigue, fever and unexpected weight change.  HENT: Negative.    Eyes: Negative.  Negative for photophobia and visual disturbance.  Respiratory:  Negative for cough, chest tightness and shortness of breath.   Cardiovascular:  Negative for chest pain, palpitations and leg swelling.  Gastrointestinal:  Negative for abdominal pain, blood in stool, constipation, diarrhea, nausea and vomiting.  Endocrine: Negative.   Genitourinary:  Negative for decreased urine volume, difficulty urinating, dysuria, frequency and urgency.  Skin: Negative.   Allergic/Immunologic: Negative.   Neurological:  Negative for dizziness, tremors, seizures, syncope, facial asymmetry, speech difficulty, weakness, light-headedness, numbness and headaches.  Hematological: Negative.   Psychiatric/Behavioral:  Negative for confusion, hallucinations, sleep disturbance and suicidal ideas.   All other systems reviewed and are negative.       Objective:  BP 120/62   Pulse (!) 51   Temp (!) 97.5 F (36.4 C) (Temporal)   Ht 5\' 7"  (1.702 m)   Wt 231 lb 9.6 oz (105.1 kg)   LMP 08/14/2021   SpO2 98%   BMI 36.27 kg/m    Wt Readings from Last 3 Encounters:  07/12/23 231 lb 9.6 oz (105.1 kg)  05/28/23 228 lb 8 oz (103.6 kg)  07/31/22 210 lb (95.3 kg)    Physical Exam Vitals and nursing note reviewed.  Constitutional:      Appearance: Normal appearance. She is obese.  HENT:     Head: Normocephalic and atraumatic.     Nose: Nose normal.     Mouth/Throat:     Mouth: Mucous membranes are moist.  Eyes:      Conjunctiva/sclera: Conjunctivae normal.     Pupils: Pupils are equal, round, and reactive to light.  Cardiovascular:     Rate and Rhythm: Normal rate and regular rhythm.     Heart sounds: Normal heart sounds.  Pulmonary:     Effort: Pulmonary effort is normal.     Breath sounds: Normal breath sounds.  Musculoskeletal:     Cervical back: Normal range of motion and neck supple.  Skin:    General: Skin is warm and dry.     Capillary Refill: Capillary refill takes less than 2 seconds.  Neurological:     General: No focal deficit present.     Mental Status: She is alert and oriented to person, place, and time.  Psychiatric:        Mood and Affect: Mood normal.        Behavior:  Behavior normal.        Thought Content: Thought content normal.        Judgment: Judgment normal.     Results for orders placed or performed in visit on 05/28/23  Anemia Profile B  Result Value Ref Range   Total Iron Binding Capacity 309 250 - 450 ug/dL   UIBC 161 096 - 045 ug/dL   Iron 39 27 - 409 ug/dL   Iron Saturation 13 (L) 15 - 55 %   Ferritin 51 15 - 150 ng/mL   Vitamin B-12 428 232 - 1,245 pg/mL   Folate 6.2 >3.0 ng/mL   WBC 6.6 3.4 - 10.8 x10E3/uL   RBC 4.59 3.77 - 5.28 x10E6/uL   Hemoglobin 12.0 11.1 - 15.9 g/dL   Hematocrit 81.1 91.4 - 46.6 %   MCV 84 79 - 97 fL   MCH 26.1 (L) 26.6 - 33.0 pg   MCHC 31.0 (L) 31.5 - 35.7 g/dL   RDW 78.2 95.6 - 21.3 %   Platelets 317 150 - 450 x10E3/uL   Neutrophils 67 Not Estab. %   Lymphs 24 Not Estab. %   Monocytes 7 Not Estab. %   Eos 1 Not Estab. %   Basos 1 Not Estab. %   Neutrophils Absolute 4.4 1.4 - 7.0 x10E3/uL   Lymphocytes Absolute 1.6 0.7 - 3.1 x10E3/uL   Monocytes Absolute 0.4 0.1 - 0.9 x10E3/uL   EOS (ABSOLUTE) 0.1 0.0 - 0.4 x10E3/uL   Basophils Absolute 0.0 0.0 - 0.2 x10E3/uL   Immature Granulocytes 0 Not Estab. %   Immature Grans (Abs) 0.0 0.0 - 0.1 x10E3/uL   Retic Ct Pct 1.9 0.6 - 2.6 %  CMP14+EGFR  Result Value Ref Range    Glucose 109 (H) 70 - 99 mg/dL   BUN 14 6 - 24 mg/dL   Creatinine, Ser 0.86 (H) 0.57 - 1.00 mg/dL   eGFR 62 >57 QI/ONG/2.95   BUN/Creatinine Ratio 13 9 - 23   Sodium 137 134 - 144 mmol/L   Potassium 3.8 3.5 - 5.2 mmol/L   Chloride 98 96 - 106 mmol/L   CO2 26 20 - 29 mmol/L   Calcium 9.9 8.7 - 10.2 mg/dL   Total Protein 7.6 6.0 - 8.5 g/dL   Albumin 4.3 3.9 - 4.9 g/dL   Globulin, Total 3.3 1.5 - 4.5 g/dL   Bilirubin Total 0.5 0.0 - 1.2 mg/dL   Alkaline Phosphatase 90 44 - 121 IU/L   AST 30 0 - 40 IU/L   ALT 25 0 - 32 IU/L  Thyroid Panel With TSH  Result Value Ref Range   TSH 2.740 0.450 - 4.500 uIU/mL   T4, Total 6.6 4.5 - 12.0 ug/dL   T3 Uptake Ratio 24 24 - 39 %   Free Thyroxine Index 1.6 1.2 - 4.9  Lipid panel  Result Value Ref Range   Cholesterol, Total 168 100 - 199 mg/dL   Triglycerides 71 0 - 149 mg/dL   HDL 40 >28 mg/dL   VLDL Cholesterol Cal 14 5 - 40 mg/dL   LDL Chol Calc (NIH) 413 (H) 0 - 99 mg/dL   Chol/HDL Ratio 4.2 0.0 - 4.4 ratio  VITAMIN D 25 Hydroxy (Vit-D Deficiency, Fractures)  Result Value Ref Range   Vit D, 25-Hydroxy 28.0 (L) 30.0 - 100.0 ng/mL  Brain natriuretic peptide  Result Value Ref Range   BNP <2.5 0.0 - 100.0 pg/mL       Pertinent labs & imaging results that were  available during my care of the patient were reviewed by me and considered in my medical decision making.  Assessment & Plan:  Natilynn was seen today for blood pressure check.  Diagnoses and all orders for this visit:  Primary hypertension BP well controlled. Changes were not made in regimen today. Goal BP is 130/80. Pt aware to report any persistent high or low readings. DASH diet and exercise encouraged. Exercise at least 150 minutes per week and increase as tolerated. Goal BMI > 25. Stress management encouraged. Avoid nicotine and tobacco product use. Avoid excessive alcohol and NSAID's. Avoid more than 2000 mg of sodium daily. Medications as prescribed. Follow up as scheduled.   -     BMP8+EGFR     Continue all other maintenance medications.  Follow up plan: Return in about 6 months (around 01/10/2024) for CPE.   Continue healthy lifestyle choices, including diet (rich in fruits, vegetables, and lean proteins, and low in salt and simple carbohydrates) and exercise (at least 30 minutes of moderate physical activity daily).  Educational handout given for DASH   The above assessment and management plan was discussed with the patient. The patient verbalized understanding of and has agreed to the management plan. Patient is aware to call the clinic if they develop any new symptoms or if symptoms persist or worsen. Patient is aware when to return to the clinic for a follow-up visit. Patient educated on when it is appropriate to go to the emergency department.   Kari Baars, FNP-C [facility] [phone]

## 2023-07-13 LAB — BMP8+EGFR
BUN/Creatinine Ratio: 11 (ref 9–23)
BUN: 13 mg/dL (ref 6–24)
CO2: 22 mmol/L (ref 20–29)
Calcium: 9.7 mg/dL (ref 8.7–10.2)
Chloride: 99 mmol/L (ref 96–106)
Creatinine, Ser: 1.14 mg/dL — ABNORMAL HIGH (ref 0.57–1.00)
Glucose: 94 mg/dL (ref 70–99)
Potassium: 3.8 mmol/L (ref 3.5–5.2)
Sodium: 140 mmol/L (ref 134–144)
eGFR: 61 mL/min/{1.73_m2} (ref >59)

## 2023-07-15 ENCOUNTER — Encounter: Payer: Self-pay | Admitting: Family Medicine

## 2023-08-05 ENCOUNTER — Other Ambulatory Visit: Payer: Self-pay | Admitting: *Deleted

## 2023-08-05 DIAGNOSIS — M7989 Other specified soft tissue disorders: Secondary | ICD-10-CM

## 2023-08-05 DIAGNOSIS — I10 Essential (primary) hypertension: Secondary | ICD-10-CM

## 2023-08-05 MED ORDER — LOSARTAN POTASSIUM-HCTZ 100-25 MG PO TABS: 1.0000 | ORAL_TABLET | Freq: Every day | ORAL | 0 refills | Status: DC

## 2023-09-22 ENCOUNTER — Encounter (HOSPITAL_COMMUNITY): Payer: Self-pay

## 2023-09-22 ENCOUNTER — Emergency Department (HOSPITAL_COMMUNITY)
Admission: EM | Admit: 2023-09-22 | Discharge: 2023-09-22 | Disposition: A | Discharge status: Home / Self Care | Payer: BC Managed Care – PPO | Attending: Emergency Medicine | Admitting: Emergency Medicine

## 2023-09-22 ENCOUNTER — Emergency Department (HOSPITAL_COMMUNITY): Payer: BC Managed Care – PPO

## 2023-09-22 ENCOUNTER — Other Ambulatory Visit: Payer: Self-pay

## 2023-09-22 DIAGNOSIS — R079 Chest pain, unspecified: Secondary | ICD-10-CM

## 2023-09-22 DIAGNOSIS — Z79899 Other long term (current) drug therapy: Secondary | ICD-10-CM | POA: Insufficient documentation

## 2023-09-22 DIAGNOSIS — R519 Headache, unspecified: Secondary | ICD-10-CM | POA: Insufficient documentation

## 2023-09-22 DIAGNOSIS — R0789 Other chest pain: Secondary | ICD-10-CM | POA: Insufficient documentation

## 2023-09-22 DIAGNOSIS — I10 Essential (primary) hypertension: Secondary | ICD-10-CM | POA: Diagnosis not present

## 2023-09-22 LAB — CBC WITH DIFFERENTIAL/PLATELET
Abs Immature Granulocytes: 0.03 10*3/uL (ref 0.00–0.07)
Basophils Absolute: 0.1 10*3/uL (ref 0.0–0.1)
Basophils Relative: 1 %
Eosinophils Absolute: 0.1 10*3/uL (ref 0.0–0.5)
Eosinophils Relative: 1 %
HCT: 38.8 % (ref 36.0–46.0)
Hemoglobin: 12 g/dL (ref 12.0–15.0)
Immature Granulocytes: 0 %
Lymphocytes Relative: 20 %
Lymphs Abs: 1.7 10*3/uL (ref 0.7–4.0)
MCH: 27.3 pg (ref 26.0–34.0)
MCHC: 30.9 g/dL (ref 30.0–36.0)
MCV: 88.4 fL (ref 80.0–100.0)
Monocytes Absolute: 0.5 10*3/uL (ref 0.1–1.0)
Monocytes Relative: 6 %
Neutro Abs: 5.9 10*3/uL (ref 1.7–7.7)
Neutrophils Relative %: 72 %
Platelets: 298 10*3/uL (ref 150–400)
RBC: 4.39 MIL/uL (ref 3.87–5.11)
RDW: 12.9 % (ref 11.5–15.5)
WBC: 8.2 10*3/uL (ref 4.0–10.5)
nRBC: 0 % (ref 0.0–0.2)

## 2023-09-22 LAB — COMPREHENSIVE METABOLIC PANEL
ALT: 17 U/L (ref 0–44)
AST: 21 U/L (ref 15–41)
Albumin: 4.2 g/dL (ref 3.5–5.0)
Alkaline Phosphatase: 74 U/L (ref 38–126)
Anion gap: 9 (ref 5–15)
BUN: 16 mg/dL (ref 6–20)
CO2: 28 mmol/L (ref 22–32)
Calcium: 9.1 mg/dL (ref 8.9–10.3)
Chloride: 98 mmol/L (ref 98–111)
Creatinine, Ser: 1.1 mg/dL — ABNORMAL HIGH (ref 0.44–1.00)
GFR, Estimated: >60 mL/min (ref >60)
Glucose, Bld: 106 mg/dL — ABNORMAL HIGH (ref 70–99)
Potassium: 3.3 mmol/L — ABNORMAL LOW (ref 3.5–5.1)
Sodium: 135 mmol/L (ref 135–145)
Total Bilirubin: 0.7 mg/dL (ref <1.2)
Total Protein: 8.3 g/dL — ABNORMAL HIGH (ref 6.5–8.1)

## 2023-09-22 LAB — TROPONIN I (HIGH SENSITIVITY): Troponin I (High Sensitivity): 2 ng/L (ref <18)

## 2023-09-22 MED ORDER — ACETAMINOPHEN 500 MG PO TABS
1000.0000 mg | ORAL_TABLET | Freq: Once | ORAL | Status: AC
Start: 2023-09-22 — End: 2023-09-22
  Administered 2023-09-22: 1000 mg via ORAL
  Filled 2023-09-22: qty 2

## 2023-09-22 NOTE — ED Provider Notes (Signed)
[place] [facility]ERGENCY DEPARTMENT AT [facility] Provider Note   CSN: [id number] Arrival date & time: 09/22/23  1733     History  Chief Complaint  Patient presents with   Chest Pain   HPI Ashley [name] is a 47 y.o. female with history of hypertension presenting for chest pain and headache. Symptoms started all of a sudden while driving. States she had a headache with visual disturbance states she saw a black cloud in both visual fields.  States she had chest pain into the center of her chest radiates to the left of her chest. States she was a bit short of breath at that time as well.  Also stated she had some dizziness initially and characterized as a room spinning sensation.  States that overall symptoms have improved now just with some chest tightness.  Visual disturbance was resolved she thinks a minute after it started.  States she has been taking her blood pressure medications as prescribed.  Denies any urinary changes.   Chest Pain      Home Medications Prior to Admission medications   Medication Sig Start Date End Date Taking? Authorizing Provider  amLODipine (NORVASC) 5 MG tablet Take 1 tablet (5 mg total) by mouth daily. 05/28/23   Sonny Masters, FNP  losartan-hydrochlorothiazide (HYZAAR) 100-25 MG tablet Take 1 tablet by mouth daily. 08/05/23   Sonny Masters, FNP  potassium chloride SA (KLOR-CON M) 20 MEQ tablet Take 2 tablets (40 mEq total) by mouth daily. 01/16/22   Sonny Masters, FNP      Allergies    Patient has no known allergies.    Review of Systems   Review of Systems  Cardiovascular:  Positive for chest pain.    Physical Exam   Vitals:   09/22/23 1740 09/22/23 1845  BP: (!) 205/110 130/79  Pulse: (!) 58 (!) 59  Resp: 18 19  Temp: 98.2 F (36.8 C)   SpO2: 100% 99%    CONSTITUTIONAL:  well-appearing, NAD NEURO:  GCS 15. Speech is goal oriented. No deficits appreciated to CN III-XII; symmetric eyebrow raise, no facial drooping, tongue  midline. Patient has equal grip strength bilaterally with 5/5 strength against resistance in all major muscle groups bilaterally. Sensation to light touch intact. Patient moves extremities without ataxia. Normal finger-nose-finger. Patient ambulatory with steady gait. EYES:  eyes equal and reactive ENT/NECK:  Supple, no stridor  CARDIO:  regular rate and rhythm, appears well-perfused  PULM:  No respiratory distress, CTAB GI/GU:  non-distended, soft MSK/SPINE:  No gross deformities, no edema, moves all extremities  SKIN:  no rash, atraumatic  *Additional and/or pertinent findings included in MDM below  ED Results / Procedures / Treatments   Labs (all labs ordered are listed, but only abnormal results are displayed) Labs Reviewed  COMPREHENSIVE METABOLIC PANEL - Abnormal; Notable for the following components:      Result Value   Potassium 3.3 (*)    Glucose, Bld 106 (*)    Creatinine, Ser 1.10 (*)    Total Protein 8.3 (*)    All other components within normal limits  CBC WITH DIFFERENTIAL/PLATELET  TROPONIN I (HIGH SENSITIVITY)    EKG EKG Interpretation Date/Time:  Sunday September 22 2023 17:40:15 EST Ventricular Rate:  63 PR Interval:  198 QRS Duration:  92 QT Interval:  412 QTC Calculation: 421 R Axis:   33  Text Interpretation: Normal sinus rhythm with sinus arrhythmia Low voltage QRS Nonspecific T wave abnormality Abnormal ECG When compared  with ECG of 04-Sep-2021 13:54, No significant change was found Confirmed by Eber Hong [phone]) on 09/22/2023 5:51:34 PM  Radiology CT Head Wo Contrast  Result Date: 09/22/2023 CLINICAL DATA:  Headache, chest pain EXAM: CT HEAD WITHOUT CONTRAST TECHNIQUE: Contiguous axial images were obtained from the base of the skull through the vertex without intravenous contrast. RADIATION DOSE REDUCTION: This exam was performed according to the departmental dose-optimization program which includes automated exposure control, adjustment of the mA  and/or kV according to patient size and/or use of iterative reconstruction technique. COMPARISON:  08/07/2005 FINDINGS: Brain: No evidence of acute infarction, hemorrhage, mass, mass effect, or midline shift. No hydrocephalus or extra-axial fluid collection. Partial empty sella. Craniocervical junction within normal limits. Vascular: No hyperdense vessel. Skull: Negative for fracture or focal lesion. Sinuses/Orbits: Clear paranasal sinuses. The optic nerves are somewhat tortuous. No acute finding in the orbits. Other: The mastoid air cells are well aerated. IMPRESSION: 1. No acute intracranial process. 2. Partial empty sella and tortuous optic nerves, which are nonspecific but can be seen in the setting of idiopathic intracranial hypertension. Electronically Signed   By: Wiliam Ke M.D.   On: 09/22/2023 19:05   DG Chest 2 View  Result Date: 09/22/2023 CLINICAL DATA:  chest pain. EXAM: CHEST - 2 VIEW COMPARISON:  08/08/2007 FINDINGS: Midline trachea. Mild cardiomegaly. Mediastinal contours otherwise within normal limits. No pleural effusion or pneumothorax. Biapical pleural thickening is mild. No congestive failure. Clear lungs. Numerous leads and wires project over the chest. IMPRESSION: Mild cardiomegaly, without acute disease. Electronically Signed   By: Jeronimo Greaves M.D.   On: 09/22/2023 18:46    Procedures Procedures    Medications Ordered in ED Medications  acetaminophen (TYLENOL) tablet 1,000 mg (1,000 mg Oral Given 09/22/23 1912)    ED Course/ Medical Decision Making/ A&P                                 Medical Decision Making Amount and/or Complexity of Data Reviewed Labs: ordered. Radiology: ordered. ECG/medicine tests: ordered.   Initial Impression and Ddx 47 year old well-appearing female presenting for chest pain and headache.  Exam is unremarkable.  DDx includes stroke, ACS, hypertensive emergency, AKI, flash pulmonary edema, other. Patient PMH that increases complexity  of ED encounter:  HTN  Interpretation of Diagnostics - I independent reviewed and interpreted the labs as followed: Mild hypokalemia  - I independently visualized the following imaging with scope of interpretation limited to determining acute life threatening conditions related to emergency care: CT head, which revealed no acute intracranial process,  Partial empty sella and tortuous optic nerves, which are nonspecific but can be seen in the setting of idiopathic intracranial hypertension. CXR no acute findins, mild cardiomegaly  - I personally reviewed and interpreted EKG which revealed normal sinus rhythm  Patient Reassessment and Ultimate Disposition/Management On reassessment, blood pressure had improved significantly without intervention.  Patient was notably anxious initially and walking down the hall to her room, suspect her blood pressure could have been higher due to her anxiety at that time.  Reassuring workup here overall.  Discussed imaging findings with her and advised her to follow with her PCP and review the results in the less than 7 days.  Discussed return precautions.  Vital stable.  Discharged in good condition.  Patient management required discussion with the following services or consulting groups:  None  Complexity of Problems Addressed Acute complicated illness or Injury  Additional Data Reviewed and Analyzed Further history obtained from: Further history from spouse/family member, Past medical history and medications listed in the EMR, and Prior ED visit notes  Patient Encounter Risk Assessment None         Final Clinical Impression(s) / ED Diagnoses Final diagnoses:  Nonintractable headache, unspecified chronicity pattern, unspecified headache type  Chest pain, unspecified type    Rx / DC Orders ED Discharge Orders     None         Gareth Eagle, PA-C 09/22/23 1915    Pricilla Loveless, MD 09/25/23 1511

## 2023-09-22 NOTE — Discharge Instructions (Signed)
Evaluation today was overall reassuring.  Recommend you follow-up with your PCP.  Continue taking your blood pressure medications as prescribed.  If your symptoms return please return emergency department further evaluation.

## 2023-09-22 NOTE — ED Triage Notes (Signed)
Chest pain center of chest Pressure like pain SOB, dizzy, HA   Pt states she feels like she is having anxiety issues. Started about 5 mins ago when driving near the hospital

## 2023-09-25 ENCOUNTER — Encounter: Payer: Self-pay | Admitting: Family Medicine

## 2023-09-25 ENCOUNTER — Ambulatory Visit: Payer: BC Managed Care – PPO | Admitting: Family Medicine

## 2023-09-26 ENCOUNTER — Ambulatory Visit: Payer: BC Managed Care – PPO | Admitting: Family Medicine

## 2023-09-26 ENCOUNTER — Encounter: Payer: Self-pay | Admitting: Family Medicine

## 2023-09-26 VITALS — BP 139/92 | HR 63 | Temp 97.8°F | Resp 20 | Ht 67.0 in | Wt 235.2 lb

## 2023-09-26 DIAGNOSIS — R42 Dizziness and giddiness: Secondary | ICD-10-CM

## 2023-09-26 DIAGNOSIS — R55 Syncope and collapse: Secondary | ICD-10-CM | POA: Diagnosis not present

## 2023-09-26 DIAGNOSIS — R631 Polydipsia: Secondary | ICD-10-CM

## 2023-09-26 DIAGNOSIS — E876 Hypokalemia: Secondary | ICD-10-CM | POA: Diagnosis not present

## 2023-09-26 DIAGNOSIS — N1831 Chronic kidney disease, stage 3a: Secondary | ICD-10-CM

## 2023-09-26 DIAGNOSIS — R930 Abnormal findings on diagnostic imaging of skull and head, not elsewhere classified: Secondary | ICD-10-CM | POA: Diagnosis not present

## 2023-09-26 LAB — BAYER DCA HB A1C WAIVED: HB A1C (BAYER DCA - WAIVED): 5.4 % (ref 4.8–5.6)

## 2023-09-26 NOTE — Progress Notes (Addendum)
Subjective:  Patient ID: Ashley [name], female    DOB: December 09, 1975, 47 y.o.   MRN: [id number]  Patient Care Team: Sonny Masters, FNP as PCP - General (Family Medicine)   Chief Complaint:  Medical Management of Chronic Issues   HPI: Ashley [name] is a 47 y.o. female presenting on 09/26/2023 for Medical Management of Chronic Issues   Discussed the use of AI scribe software for clinical note transcription with the patient, who gave verbal consent to proceed.  History of Present Illness   The patient presented today for ED follow up. she was seen in the ED for an episode of dizziness while driving. The dizziness was described as severe, with a sensation of the eyes shaking and a gradual loss of consciousness. The episode lasted for a few minutes and was accompanied by rapid breathing and chest tightness. The patient also reported intermittent episodes of dizziness that required her to stop and regain composure.  The patient was taken to the ER following the initial episode, where a cardiac and neurology workup and CT scan were performed. The results were reportedly normal, with a slightly low potassium level noted. The patient also reported a constant pain in her hands, unrelated to the dizziness.  The patient also reported an unusual episode of extreme thirst, consuming multiple glasses of water in a short period. This was a deviation from her normal hydration habits.  The patient has not seen a neurologist in the past but mentioned that a previous doctor had noted something at the back of her brain. The patient was unsure of the specifics of this finding. The patient also expressed concern about driving due to the dizziness episodes and admitted to feeling nervous when doing so.  The patient denied taking any medication for her symptoms. She also denied any palpitations during the dizziness episodes. The patient is currently awaiting further evaluation from a neurologist.         Relevant past medical, surgical, family, and social history reviewed and updated as indicated.  Allergies and medications reviewed and updated. Data reviewed: Chart in Epic.   Past Medical History:  Diagnosis Date   Hernia of abdominal cavity    Hypertension     Past Surgical History:  Procedure Laterality Date   CERVICAL CONIZATION W/BX N/A 09/06/2021   Procedure: ENDOCERVICAL CONIZATION;  Surgeon: Lazaro Arms, MD;  Location: [facility];  Service: Gynecology;  Laterality: N/A;   CHOLECYSTECTOMY  2011   HERNIA REPAIR     umbilical hernia   SALPINGOOPHORECTOMY Right 09/06/2021   Procedure: OPEN RIGHT SALPINGO OOPHORECTOMY;  Surgeon: Lazaro Arms, MD;  Location: [facility];  Service: Gynecology;  Laterality: Right;   SUPRACERVICAL ABDOMINAL HYSTERECTOMY N/A 09/06/2021   Procedure: HYSTERECTOMY SUPRACERVICAL ABDOMINAL;  Surgeon: Lazaro Arms, MD;  Location: [facility];  Service: Gynecology;  Laterality: N/A;   TUBAL LIGATION  2009   UNILATERAL SALPINGECTOMY Left 09/06/2021   Procedure: LEFT SALPINGECTOMY;  Surgeon: Lazaro Arms, MD;  Location: [facility];  Service: Gynecology;  Laterality: Left;    Social History   Socioeconomic History   Marital status: Married    Spouse name: Minerva Areola   Number of children: 3   Years of education: Not on file   Highest education level: Not on file  Occupational History   Not on file  Tobacco Use   Smoking status: Never   Smokeless tobacco: Never  Vaping Use   Vaping status: Never Used  Substance and  Sexual Activity   Alcohol use: Not Currently   Drug use: Never   Sexual activity: Not Currently    Birth control/protection: Surgical    Comment: tubal/hyst  Other Topics Concern   Not on file  Social History Narrative   Married since June 2004.Lives with husband and 3 girls.Works as Stage manager.   Social Determinants of Health   Financial Resource Strain: Medium Risk (05/16/2021)   Overall Financial Resource Strain (CARDIA)     Difficulty of Paying Living Expenses: Somewhat hard  Food Insecurity: No Food Insecurity (05/16/2021)   Hunger Vital Sign    Worried About Running Out of Food in the Last Year: Never true    Ran Out of Food in the Last Year: Never true  Transportation Needs: No Transportation Needs (05/16/2021)   PRAPARE - Administrator, Civil Service (Medical): No    Lack of Transportation (Non-Medical): No  Physical Activity: Inactive (05/16/2021)   Exercise Vital Sign    Days of Exercise per Week: 0 days    Minutes of Exercise per Session: 0 min  Stress: No Stress Concern Present (05/16/2021)   [employer] of Occupational Health - Occupational Stress Questionnaire    Feeling of Stress : Not at all  Social Connections: Unknown (03/14/2022)   Received from [facility]   Social Network    Social Network: Not on file  Intimate Partner Violence: Unknown (02/16/2022)   Received from [facility], [facility]   HITS    Physically Hurt: Not on file    Insult or Talk Down To: Not on file    Threaten Physical Harm: Not on file    Scream or Curse: Not on file    Outpatient Encounter Medications as of 09/26/2023  Medication Sig   amLODipine (NORVASC) 5 MG tablet Take 1 tablet (5 mg total) by mouth daily. (Patient taking differently: Take 5 mg by mouth at bedtime.)   losartan-hydrochlorothiazide (HYZAAR) 100-25 MG tablet Take 1 tablet by mouth daily.   potassium chloride SA (KLOR-CON M) 20 MEQ tablet Take 2 tablets (40 mEq total) by mouth daily.   No facility-administered encounter medications on file as of 09/26/2023.    No Known Allergies  Pertinent ROS per HPI, otherwise unremarkable      Objective:  BP (!) 139/92   Pulse 63   Temp 97.8 F (36.6 C) (Oral)   Resp 20   Ht 5\' 7"  (1.702 m)   Wt 235 lb 4 oz (106.7 kg)   LMP 08/14/2021   SpO2 97%   BMI 36.85 kg/m    Wt Readings from Last 3 Encounters:  09/26/23 235 lb 4 oz (106.7 kg)  09/22/23 230 lb (104.3 kg)   07/12/23 231 lb 9.6 oz (105.1 kg)    Physical Exam Vitals and nursing note reviewed.  Constitutional:      General: She is not in acute distress.    Appearance: Normal appearance. She is obese. She is not ill-appearing, toxic-appearing or diaphoretic.  HENT:     Head: Normocephalic and atraumatic.     Nose: Nose normal.     Mouth/Throat:     Mouth: Mucous membranes are moist.     Pharynx: Oropharynx is clear.  Eyes:     Pupils: Pupils are equal, round, and reactive to light.  Neck:     Vascular: No carotid bruit.  Cardiovascular:     Rate and Rhythm: Normal rate and regular rhythm.     Heart sounds: Normal  heart sounds.  Pulmonary:     Effort: Pulmonary effort is normal.     Breath sounds: Normal breath sounds.  Musculoskeletal:     Cervical back: Normal range of motion and neck supple.  Skin:    General: Skin is warm and dry.     Capillary Refill: Capillary refill takes less than 2 seconds.  Neurological:     General: No focal deficit present.     Mental Status: She is alert and oriented to person, place, and time.     Cranial Nerves: No cranial nerve deficit.     Motor: No weakness.     Gait: Gait normal.  Psychiatric:        Mood and Affect: Mood normal.        Behavior: Behavior normal.        Thought Content: Thought content normal.        Judgment: Judgment normal.    Results for orders placed or performed in visit on 09/26/23  CMP14+EGFR  Result Value Ref Range   Glucose 82 70 - 99 mg/dL   BUN 10 6 - 24 mg/dL   Creatinine, Ser 5.36 (H) 0.57 - 1.00 mg/dL   eGFR 59 (L) >64 QI/HKV/4.25   BUN/Creatinine Ratio 9 9 - 23   Sodium 140 134 - 144 mmol/L   Potassium 3.8 3.5 - 5.2 mmol/L   Chloride 98 96 - 106 mmol/L   CO2 26 20 - 29 mmol/L   Calcium 10.0 8.7 - 10.2 mg/dL   Total Protein 8.3 6.0 - 8.5 g/dL   Albumin 4.7 3.9 - 4.9 g/dL   Globulin, Total 3.6 1.5 - 4.5 g/dL   Bilirubin Total 0.8 0.0 - 1.2 mg/dL   Alkaline Phosphatase 95 44 - 121 IU/L   AST 25 0 -  40 IU/L   ALT 15 0 - 32 IU/L  CBC with Differential/Platelet  Result Value Ref Range   WBC 7.3 3.4 - 10.8 x10E3/uL   RBC 4.38 3.77 - 5.28 x10E6/uL   Hemoglobin 12.0 11.1 - 15.9 g/dL   Hematocrit 95.6 38.7 - 46.6 %   MCV 87 79 - 97 fL   MCH 27.4 26.6 - 33.0 pg   MCHC 31.4 (L) 31.5 - 35.7 g/dL   RDW 56.4 33.2 - 95.1 %   Platelets 336 150 - 450 x10E3/uL   Neutrophils 65 Not Estab. %   Lymphs 26 Not Estab. %   Monocytes 7 Not Estab. %   Eos 1 Not Estab. %   Basos 1 Not Estab. %   Neutrophils Absolute 4.7 1.4 - 7.0 x10E3/uL   Lymphocytes Absolute 1.9 0.7 - 3.1 x10E3/uL   Monocytes Absolute 0.5 0.1 - 0.9 x10E3/uL   EOS (ABSOLUTE) 0.1 0.0 - 0.4 x10E3/uL   Basophils Absolute 0.1 0.0 - 0.2 x10E3/uL   Immature Granulocytes 0 Not Estab. %   Immature Grans (Abs) 0.0 0.0 - 0.1 x10E3/uL  Bayer DCA Hb A1c Waived  Result Value Ref Range   HB A1C (BAYER DCA - WAIVED) 5.4 4.8 - 5.6 %  Thyroid Panel With TSH  Result Value Ref Range   TSH 1.350 0.450 - 4.500 uIU/mL   T4, Total 6.9 4.5 - 12.0 ug/dL   T3 Uptake Ratio 23 (L) 24 - 39 %   Free Thyroxine Index 1.6 1.2 - 4.9       Pertinent labs & imaging results that were available during my care of the patient were reviewed by me and considered in my medical  decision making.  Assessment & Plan:  Kimly was seen today for medical management of chronic issues.  Diagnoses and all orders for this visit:  Near syncope -     CMP14+EGFR -     CBC with Differential/Platelet -     Ambulatory referral to Neurology -     Bayer DCA Hb A1c Waived -     Thyroid Panel With TSH  Dizziness -     CMP14+EGFR -     CBC with Differential/Platelet -     Ambulatory referral to Neurology -     Bayer DCA Hb A1c Waived -     Thyroid Panel With TSH  Abnormal head CT -     Ambulatory referral to Neurology  Hypokalemia -     CMP14+EGFR  Polydipsia -     CMP14+EGFR -     Bayer DCA Hb A1c Waived     Assessment and Plan    Dizziness and Near  Syncope Intermittent episodes of dizziness and near syncope, with a recent acute episode while driving. Cardiac workup and CT scan were unremarkable. Potassium was slightly low, and glucose was slightly high. Differential includes anxiety, idiopathic intracranial hypertension, and possible posterior circulation issues. Discussed the importance of ruling out serious conditions such as posterior circulation problems and idiopathic intracranial hypertension. Anxiety could also be a contributing factor, and if no underlying cause is found, treatment for anxiety may be considered. - Refer to neurology for further evaluation - Repeat potassium and CBC - Check A1c to assess blood sugar control - Advise caution with driving until episodes are resolved  Idiopathic Intracranial Hypertension Possible idiopathic intracranial hypertension suggested by partial empty sella and tortuous optic nerves on imaging. Symptoms include dizziness, headaches, and visual disturbances. Discussed the potential need for further evaluation by a neurologist to confirm diagnosis and determine appropriate management. - Refer to neurology for further evaluation and management  Anxiety Possible anxiety contributing to symptoms of dizziness, hyperventilation, and chest tightness. Symptoms may include physical manifestations such as chest pain, palpitations, and fatigue. If neurology workup is unremarkable, anxiety treatment may be necessary. - Evaluate for anxiety if neurology workup is unremarkable - Consider starting medication for anxiety if symptoms persist  General Health Maintenance Reports increased thirst and frequent water intake. Blood glucose was slightly high during the ER visit. Discussed the importance of monitoring blood sugar levels to rule out diabetes or other metabolic issues. - Check A1c to assess blood sugar control  Follow-up - Follow up with neurology after referral - Repeat labs including potassium, CBC,  and A1c - Advise to return if symptoms worsen or new symptoms develop.          Continue all other maintenance medications.  Follow up plan: Return if symptoms worsen or fail to improve.   Continue healthy lifestyle choices, including diet (rich in fruits, vegetables, and lean proteins, and low in salt and simple carbohydrates) and exercise (at least 30 minutes of moderate physical activity daily).  The above assessment and management plan was discussed with the patient. The patient verbalized understanding of and has agreed to the management plan. Patient is aware to call the clinic if they develop any new symptoms or if symptoms persist or worsen. Patient is aware when to return to the clinic for a follow-up visit. Patient educated on when it is appropriate to go to the emergency department.   Kari Baars, FNP-C [facility] [phone]

## 2023-09-27 LAB — CBC WITH DIFFERENTIAL/PLATELET
Basophils Absolute: 0.1 10*3/uL (ref 0.0–0.2)
Basos: 1 %
EOS (ABSOLUTE): 0.1 10*3/uL (ref 0.0–0.4)
Eos: 1 %
Hematocrit: 38.2 % (ref 34.0–46.6)
Hemoglobin: 12 g/dL (ref 11.1–15.9)
Immature Grans (Abs): 0 10*3/uL (ref 0.0–0.1)
Immature Granulocytes: 0 %
Lymphocytes Absolute: 1.9 10*3/uL (ref 0.7–3.1)
Lymphs: 26 %
MCH: 27.4 pg (ref 26.6–33.0)
MCHC: 31.4 g/dL — ABNORMAL LOW (ref 31.5–35.7)
MCV: 87 fL (ref 79–97)
Monocytes Absolute: 0.5 10*3/uL (ref 0.1–0.9)
Monocytes: 7 %
Neutrophils Absolute: 4.7 10*3/uL (ref 1.4–7.0)
Neutrophils: 65 %
Platelets: 336 10*3/uL (ref 150–450)
RBC: 4.38 x10E6/uL (ref 3.77–5.28)
RDW: 13.1 % (ref 11.7–15.4)
WBC: 7.3 10*3/uL (ref 3.4–10.8)

## 2023-09-27 LAB — CMP14+EGFR
ALT: 15 [IU]/L (ref 0–32)
AST: 25 [IU]/L (ref 0–40)
Albumin: 4.7 g/dL (ref 3.9–4.9)
Alkaline Phosphatase: 95 [IU]/L (ref 44–121)
BUN/Creatinine Ratio: 9 (ref 9–23)
BUN: 10 mg/dL (ref 6–24)
Bilirubin Total: 0.8 mg/dL (ref 0.0–1.2)
CO2: 26 mmol/L (ref 20–29)
Calcium: 10 mg/dL (ref 8.7–10.2)
Chloride: 98 mmol/L (ref 96–106)
Creatinine, Ser: 1.17 mg/dL — ABNORMAL HIGH (ref 0.57–1.00)
Globulin, Total: 3.6 g/dL (ref 1.5–4.5)
Glucose: 82 mg/dL (ref 70–99)
Potassium: 3.8 mmol/L (ref 3.5–5.2)
Sodium: 140 mmol/L (ref 134–144)
Total Protein: 8.3 g/dL (ref 6.0–8.5)
eGFR: 59 mL/min/{1.73_m2} — ABNORMAL LOW (ref >59)

## 2023-09-27 LAB — THYROID PANEL WITH TSH
Free Thyroxine Index: 1.6 (ref 1.2–4.9)
T3 Uptake Ratio: 23 % — ABNORMAL LOW (ref 24–39)
T4, Total: 6.9 ug/dL (ref 4.5–12.0)
TSH: 1.35 u[IU]/mL (ref 0.450–4.500)

## 2023-09-27 NOTE — Addendum Note (Signed)
Addended by: Sonny Masters on: 09/27/2023 02:28 PM   Modules accepted: Orders

## 2023-10-01 ENCOUNTER — Encounter: Payer: Self-pay | Admitting: Family Medicine

## 2023-10-02 ENCOUNTER — Encounter: Payer: Self-pay | Admitting: Neurology

## 2023-10-02 ENCOUNTER — Ambulatory Visit: Payer: BC Managed Care – PPO | Admitting: Neurology

## 2023-10-02 VITALS — Ht 67.0 in | Wt 235.0 lb

## 2023-10-02 DIAGNOSIS — R42 Dizziness and giddiness: Secondary | ICD-10-CM

## 2023-10-02 DIAGNOSIS — R519 Headache, unspecified: Secondary | ICD-10-CM | POA: Diagnosis not present

## 2023-10-02 NOTE — Progress Notes (Signed)
[facility]  PATIENT: Ashley [name] DOB: May 07, 1976  REQUESTING CLINICIAN: Sonny Masters, FNP HISTORY FROM: Patient  REASON FOR VISIT: Dizziness/Headaches    HISTORICAL  CHIEF COMPLAINT:  Chief Complaint  Patient presents with   New Patient (Initial Visit)    Rm 13, NP, dizziness, fainting,    HISTORY OF PRESENT ILLNESS:  This is a 47 year old woman past medical history of hypertension, anxiety who is presenting with complaint of dizziness.  Patient reports occasional dizziness that she describes as feeling unsteady, feeling like she is going to pass out. She reports 10 days ago, she was driving, and did have another attack where she felt like she was going to pass out as her vision was getting blurry and she felt short of breath.  She has to pull over and let her daughter drive the car.  She presented to the ED complaining of severe headache.  She did have a head CT at that time, no acute intracranial abnormality but she was noted to have a partially empty sella and a tortuous optic nerves that may be seen in patient with IIH.  When she was in the ED, her blood pressure was extremely elevated to 200 systolic. On further cock questioning she does complain of morning headache, and visual obscuration.   OTHER MEDICAL CONDITIONS: Hypertension, Anxiety    REVIEW OF SYSTEMS: Full 14 system review of systems performed and negative with exception of: As noted in the HPI  ALLERGIES: No Known Allergies  HOME MEDICATIONS: Outpatient Medications Prior to Visit  Medication Sig Dispense Refill   amLODipine (NORVASC) 5 MG tablet Take 1 tablet (5 mg total) by mouth daily. (Patient taking differently: Take 5 mg by mouth at bedtime.) 90 tablet 1   losartan-hydrochlorothiazide (HYZAAR) 100-25 MG tablet Take 1 tablet by mouth daily. 90 tablet 0   potassium chloride SA (KLOR-CON M) 20 MEQ tablet Take 2 tablets (40 mEq total) by mouth daily. 30 tablet 2   No  facility-administered medications prior to visit.    PAST MEDICAL HISTORY: Past Medical History:  Diagnosis Date   Hernia of abdominal cavity    Hypertension     PAST SURGICAL HISTORY: Past Surgical History:  Procedure Laterality Date   CERVICAL CONIZATION W/BX N/A 09/06/2021   Procedure: ENDOCERVICAL CONIZATION;  Surgeon: Lazaro Arms, MD;  Location: [facility];  Service: Gynecology;  Laterality: N/A;   CHOLECYSTECTOMY  2011   HERNIA REPAIR     umbilical hernia   SALPINGOOPHORECTOMY Right 09/06/2021   Procedure: OPEN RIGHT SALPINGO OOPHORECTOMY;  Surgeon: Lazaro Arms, MD;  Location: [facility];  Service: Gynecology;  Laterality: Right;   SUPRACERVICAL ABDOMINAL HYSTERECTOMY N/A 09/06/2021   Procedure: HYSTERECTOMY SUPRACERVICAL ABDOMINAL;  Surgeon: Lazaro Arms, MD;  Location: [facility];  Service: Gynecology;  Laterality: N/A;   TUBAL LIGATION  2009   UNILATERAL SALPINGECTOMY Left 09/06/2021   Procedure: LEFT SALPINGECTOMY;  Surgeon: Lazaro Arms, MD;  Location: [facility];  Service: Gynecology;  Laterality: Left;    FAMILY HISTORY: Family History  Problem Relation Age of Onset   Hypertension Mother    Cancer Mother        skin related / graft   CVA Father    Crohn's disease Father    Heart murmur Sister    Hypertension Maternal Grandmother    Diabetes Maternal Grandmother    Dementia Maternal Grandfather    Pneumonia Paternal Grandfather     SOCIAL HISTORY: Social History   Socioeconomic  History   Marital status: Married    Spouse name: Minerva Areola   Number of children: 3   Years of education: Not on file   Highest education level: Not on file  Occupational History   Not on file  Tobacco Use   Smoking status: Never   Smokeless tobacco: Never  Vaping Use   Vaping status: Never Used  Substance and Sexual Activity   Alcohol use: Not Currently   Drug use: Never   Sexual activity: Not Currently    Birth control/protection: Surgical    Comment: tubal/hyst  Other Topics  Concern   Not on file  Social History Narrative   Married since June 2004.Lives with husband and 3 girls.Works as Stage manager.   Right handed   Caffiene- none   Social Determinants of Health   Financial Resource Strain: Medium Risk (05/16/2021)   Overall Financial Resource Strain (CARDIA)    Difficulty of Paying Living Expenses: Somewhat hard  Food Insecurity: No Food Insecurity (05/16/2021)   Hunger Vital Sign    Worried About Running Out of Food in the Last Year: Never true    Ran Out of Food in the Last Year: Never true  Transportation Needs: No Transportation Needs (05/16/2021)   PRAPARE - Administrator, Civil Service (Medical): No    Lack of Transportation (Non-Medical): No  Physical Activity: Inactive (05/16/2021)   Exercise Vital Sign    Days of Exercise per Week: 0 days    Minutes of Exercise per Session: 0 min  Stress: No Stress Concern Present (05/16/2021)   [employer] of Occupational Health - Occupational Stress Questionnaire    Feeling of Stress : Not at all  Social Connections: Unknown (03/14/2022)   Received from [facility], [facility]   Social Network    Social Network: Not on file  Intimate Partner Violence: Unknown (02/16/2022)   Received from [facility], [facility]   HITS    Physically Hurt: Not on file    Insult or Talk Down To: Not on file    Threaten Physical Harm: Not on file    Scream or Curse: Not on file    PHYSICAL EXAM  GENERAL EXAM/CONSTITUTIONAL: Vitals:  Vitals:   10/02/23 1504  Weight: 235 lb (106.6 kg)  Height: 5\' 7"  (1.702 m)   Body mass index is 36.81 kg/m. Wt Readings from Last 3 Encounters:  10/02/23 235 lb (106.6 kg)  09/26/23 235 lb 4 oz (106.7 kg)  09/22/23 230 lb (104.3 kg)   Orthostatic Vitals for the past 48 hrs (Last 6 readings):  BP Location Patient Position (if appropriate) BP- Standing at 0 minutes Pulse- Standing at 0 minutes BP- Sitting Pulse- Sitting  10/02/23 1504 Right Arm Orthostatic  Vitals 134/80 63 138/80 61     Patient is in no distress; well developed, nourished and groomed; neck is supple  MUSCULOSKELETAL: Gait, strength, tone, movements noted in Neurologic exam below  NEUROLOGIC: MENTAL STATUS:      No data to display         awake, alert, oriented to person, place and time recent and remote memory intact normal attention and concentration language fluent, comprehension intact, naming intact fund of knowledge appropriate  CRANIAL NERVE:  2nd, 3rd, 4th, 6th - Visual fields full to confrontation, extraocular muscles intact, no nystagmus 5th - facial sensation symmetric 7th - facial strength symmetric 8th - hearing intact 9th - palate elevates symmetrically, uvula midline 11th - shoulder shrug symmetric 12th - tongue protrusion  midline  MOTOR:  normal bulk and tone, full strength in the BUE, BLE  SENSORY:  normal and symmetric to light touch  COORDINATION:  finger-nose-finger, fine finger movements normal  GAIT/STATION:  normal     DIAGNOSTIC DATA (LABS, IMAGING, TESTING) - I reviewed patient records, labs, notes, testing and imaging myself where available.  Lab Results  Component Value Date   WBC 7.3 09/26/2023   HGB 12.0 09/26/2023   HCT 38.2 09/26/2023   MCV 87 09/26/2023   PLT 336 09/26/2023      Component Value Date/Time   NA 140 09/26/2023 1605   K 3.8 09/26/2023 1605   CL 98 09/26/2023 1605   CO2 26 09/26/2023 1605   GLUCOSE 82 09/26/2023 1605   GLUCOSE 106 (H) 09/22/2023 1801   BUN 10 09/26/2023 1605   CREATININE 1.17 (H) 09/26/2023 1605   CREATININE 1.02 04/24/2021 1325   CALCIUM 10.0 09/26/2023 1605   PROT 8.3 09/26/2023 1605   ALBUMIN 4.7 09/26/2023 1605   AST 25 09/26/2023 1605   ALT 15 09/26/2023 1605   ALKPHOS 95 09/26/2023 1605   BILITOT 0.8 09/26/2023 1605   GFRNONAA >60 09/22/2023 1801   GFRNONAA 68 04/24/2021 1325   GFRAA 79 04/24/2021 1325   Lab Results  Component Value Date   CHOL 168  05/28/2023   HDL 40 05/28/2023   LDLCALC 114 (H) 05/28/2023   TRIG 71 05/28/2023   CHOLHDL 4.2 05/28/2023   Lab Results  Component Value Date   HGBA1C 5.4 09/26/2023   Lab Results  Component Value Date   VITAMINB12 428 05/28/2023   Lab Results  Component Value Date   TSH 1.350 09/26/2023    Head CT 10/02/2023 1. No acute intracranial process. 2. Partial empty sella and tortuous optic nerves, which are nonspecific but can be seen in the setting of idiopathic intracranial hypertension.   ASSESSMENT AND PLAN  47 y.o. year old female with history of hypertension, anxiety who is presenting with complaint of dizziness associated with blurry vision, feeling like she is going to pass out.  Her most recent head CT showed a partially empty sella and tortuosity of the optic nerves indicative of idiopathic intracranial hypertension.  Will obtain a lumbar puncture to confirm the diagnosis and if positive will treat accordingly.  If her LP is negative, then we will treat her dizziness with meclizine if symptomatic, and consider vestibular therapy if needed.  This was explained to both patient and mother and they are comfortable with plans.  Return sooner if worse.   1. Nonintractable headache, unspecified chronicity pattern, unspecified headache type   2. Dizziness     Patient Instructions  Lumbar puncture; if negative will treat dizziness with meclizine as needed and consider vestibular therapy in the future Continue your other medications Continue to follow with PCP return as needed.  Orders Placed This Encounter  Procedures   DG FL GUIDED LUMBAR PUNCTURE    No orders of the defined types were placed in this encounter.   Return if symptoms worsen or fail to improve.    Windell Norfolk, MD 10/02/2023, 4:16 PM  [facility] [address], [address] [place], [place] [address] [phone]

## 2023-10-02 NOTE — Patient Instructions (Signed)
Lumbar puncture; if negative will treat dizziness with meclizine as needed and consider vestibular therapy in the future Continue your other medications Continue to follow with PCP return as needed.

## 2023-10-04 ENCOUNTER — Encounter: Payer: Self-pay | Admitting: Family Medicine

## 2023-10-14 NOTE — Discharge Instructions (Signed)

## 2023-10-15 ENCOUNTER — Ambulatory Visit
Admission: RE | Admit: 2023-10-15 | Discharge: 2023-10-15 | Disposition: A | Discharge status: Home / Self Care | Payer: BC Managed Care – PPO | Source: Ambulatory Visit | Attending: Neurology | Admitting: Neurology

## 2023-10-15 ENCOUNTER — Other Ambulatory Visit: Payer: Self-pay | Admitting: Neurology

## 2023-10-15 VITALS — BP 143/71 | HR 54

## 2023-10-15 DIAGNOSIS — R519 Headache, unspecified: Secondary | ICD-10-CM

## 2023-10-15 LAB — CSF CELL COUNT WITH DIFFERENTIAL
RBC Count, CSF: 0 {cells}/uL
TOTAL NUCLEATED CELL: 1 {cells}/uL (ref 0–5)

## 2023-10-15 LAB — GLUCOSE, CSF: Glucose, CSF: 68 mg/dL (ref 40–80)

## 2023-10-15 LAB — PROTEIN, CSF: Total Protein, CSF: 33 mg/dL (ref 15–45)

## 2023-10-15 MED ORDER — MECLIZINE HCL 12.5 MG PO TABS: 12.5000 mg | ORAL_TABLET | Freq: Three times a day (TID) | ORAL | 0 refills | Status: AC | PRN

## 2023-10-20 ENCOUNTER — Other Ambulatory Visit: Payer: Self-pay | Admitting: Family Medicine

## 2023-10-20 DIAGNOSIS — E876 Hypokalemia: Secondary | ICD-10-CM

## 2023-10-21 ENCOUNTER — Other Ambulatory Visit: Payer: Self-pay | Admitting: Family Medicine

## 2023-10-21 DIAGNOSIS — M7989 Other specified soft tissue disorders: Secondary | ICD-10-CM

## 2023-10-21 DIAGNOSIS — I10 Essential (primary) hypertension: Secondary | ICD-10-CM

## 2023-10-21 MED ORDER — LOSARTAN POTASSIUM-HCTZ 100-25 MG PO TABS: 1.0000 | ORAL_TABLET | Freq: Every day | ORAL | 0 refills | Status: DC

## 2023-10-23 ENCOUNTER — Encounter: Payer: Self-pay | Admitting: Family Medicine

## 2023-10-25 ENCOUNTER — Encounter: Payer: Self-pay | Admitting: Neurology

## 2023-10-25 ENCOUNTER — Encounter: Payer: Self-pay | Admitting: *Deleted

## 2023-10-28 ENCOUNTER — Telehealth: Payer: Self-pay

## 2023-10-28 NOTE — Telephone Encounter (Signed)
Called and relayed results:  Your lumbar puncture showed a normal opening pressure, no evidence of intracranial pressure. I will prescribe Meclizine 12.5 mg to take up to 3 times daily when symptomatic. Please keep any upcoming appointments or tests and  call us with any interim questions, concerns, problems or updates. Thanks,   Windell Norfolk, MD  Pt voiced gratitude and understanding

## 2023-10-28 NOTE — Telephone Encounter (Signed)
Call to patient, left message to return call.

## 2023-10-28 NOTE — Telephone Encounter (Signed)
Pt returning call

## 2023-10-29 ENCOUNTER — Other Ambulatory Visit (HOSPITAL_COMMUNITY): Payer: Self-pay | Admitting: Nephrology

## 2023-10-29 DIAGNOSIS — E559 Vitamin D deficiency, unspecified: Secondary | ICD-10-CM

## 2023-10-29 DIAGNOSIS — N1831 Chronic kidney disease, stage 3a: Secondary | ICD-10-CM

## 2023-10-29 DIAGNOSIS — D509 Iron deficiency anemia, unspecified: Secondary | ICD-10-CM

## 2023-11-04 ENCOUNTER — Ambulatory Visit (HOSPITAL_COMMUNITY)
Admission: RE | Admit: 2023-11-04 | Discharge: 2023-11-04 | Disposition: A | Discharge status: Home / Self Care | Payer: Self-pay | Source: Ambulatory Visit | Attending: Nephrology | Admitting: Nephrology

## 2023-11-04 DIAGNOSIS — D509 Iron deficiency anemia, unspecified: Secondary | ICD-10-CM | POA: Insufficient documentation

## 2023-11-04 DIAGNOSIS — N1831 Chronic kidney disease, stage 3a: Secondary | ICD-10-CM | POA: Insufficient documentation

## 2023-11-04 DIAGNOSIS — E559 Vitamin D deficiency, unspecified: Secondary | ICD-10-CM | POA: Insufficient documentation

## 2023-11-09 ENCOUNTER — Encounter: Payer: Self-pay | Admitting: Family Medicine

## 2023-11-29 ENCOUNTER — Encounter: Payer: Self-pay | Admitting: Family Medicine

## 2023-12-03 ENCOUNTER — Other Ambulatory Visit: Payer: Self-pay | Admitting: Family Medicine

## 2023-12-03 DIAGNOSIS — I10 Essential (primary) hypertension: Secondary | ICD-10-CM

## 2023-12-03 DIAGNOSIS — M7989 Other specified soft tissue disorders: Secondary | ICD-10-CM

## 2023-12-17 ENCOUNTER — Telehealth: Payer: Self-pay | Admitting: Family Medicine

## 2023-12-17 ENCOUNTER — Ambulatory Visit: Payer: 59 | Admitting: Family Medicine

## 2023-12-17 VITALS — BP 127/77 | HR 53 | Temp 97.4°F | Ht 67.0 in | Wt 232.8 lb

## 2023-12-17 DIAGNOSIS — Z0271 Encounter for disability determination: Secondary | ICD-10-CM

## 2023-12-17 DIAGNOSIS — B354 Tinea corporis: Secondary | ICD-10-CM | POA: Diagnosis not present

## 2023-12-17 MED ORDER — KETOCONAZOLE 2 % EX CREA: 1.0000 | TOPICAL_CREAM | Freq: Every day | CUTANEOUS | 0 refills | Status: DC

## 2023-12-17 MED ORDER — CLOTRIMAZOLE 1 % EX OINT: 1.0000 | TOPICAL_OINTMENT | Freq: Two times a day (BID) | CUTANEOUS | 0 refills | Status: DC

## 2023-12-17 NOTE — Progress Notes (Signed)
 Subjective:  Patient ID: Ashley [name], female    DOB: 1976-09-06, 48 y.o.   MRN: [id number]  Patient Care Team: Severa Rock CHRISTELLA, FNP as PCP - General (Family Medicine) Delsie Annabella, NP as Nurse Practitioner (Nurse Practitioner)   Chief Complaint:  Rash (Rash on left upper arm that looks like ringworm, very itchy for 3 weeks)   HPI: Ashley [name] is a 48 y.o. female presenting on 12/17/2023 for Rash (Rash on left upper arm that looks like ringworm, very itchy for 3 weeks)   Discussed the use of AI scribe software for clinical note transcription with the patient, who gave verbal consent to proceed.  History of Present Illness   Ashley [name] is a 48 year old female who presents with a rash.  She developed a rash that initially appeared as a small spot and has since evolved into a ring-like shape with a central clearing and raised edges. The rash began after having her daughter's dog stay with her for the weekend, suggesting a possible source of the rash.  She has been applying various creams to the rash and took Benadryl due to generalized itching, which she describes as intense.          Relevant past medical, surgical, family, and social history reviewed and updated as indicated.  Allergies and medications reviewed and updated. Data reviewed: Chart in Epic.   Past Medical History:  Diagnosis Date   Hernia of abdominal cavity    Hypertension     Past Surgical History:  Procedure Laterality Date   CERVICAL CONIZATION W/BX N/A 09/06/2021   Procedure: ENDOCERVICAL CONIZATION;  Surgeon: Jayne Vonn DEL, MD;  Location: [facility];  Service: Gynecology;  Laterality: N/A;   CHOLECYSTECTOMY  2011   HERNIA REPAIR     umbilical hernia   SALPINGOOPHORECTOMY Right 09/06/2021   Procedure: OPEN RIGHT SALPINGO OOPHORECTOMY;  Surgeon: Jayne Vonn DEL, MD;  Location: [facility];  Service: Gynecology;  Laterality: Right;   SUPRACERVICAL ABDOMINAL HYSTERECTOMY N/A 09/06/2021    Procedure: HYSTERECTOMY SUPRACERVICAL ABDOMINAL;  Surgeon: Jayne Vonn DEL, MD;  Location: [facility];  Service: Gynecology;  Laterality: N/A;   TUBAL LIGATION  2009   UNILATERAL SALPINGECTOMY Left 09/06/2021   Procedure: LEFT SALPINGECTOMY;  Surgeon: Jayne Vonn DEL, MD;  Location: [facility];  Service: Gynecology;  Laterality: Left;    Social History   Socioeconomic History   Marital status: Married    Spouse name: Camellia   Number of children: 3   Years of education: Not on file   Highest education level: Some college, no degree  Occupational History   Not on file  Tobacco Use   Smoking status: Never   Smokeless tobacco: Never  Vaping Use   Vaping status: Never Used  Substance and Sexual Activity   Alcohol use: Not Currently   Drug use: Never   Sexual activity: Not Currently    Birth control/protection: Surgical    Comment: tubal/hyst  Other Topics Concern   Not on file  Social History Narrative   Married since June 2004.Lives with husband and 3 girls.Works as stage manager.   Right handed   Caffiene- none   Social Drivers of Health   Financial Resource Strain: Medium Risk (12/17/2023)   Overall Financial Resource Strain (CARDIA)    Difficulty of Paying Living Expenses: Somewhat hard  Food Insecurity: Food Insecurity Present (12/17/2023)   Hunger Vital Sign    Worried About Running Out of Food in the Last  Year: Sometimes true    Ran Out of Food in the Last Year: Never true  Transportation Needs: No Transportation Needs (12/17/2023)   PRAPARE - Administrator, Civil Service (Medical): No    Lack of Transportation (Non-Medical): No  Physical Activity: Insufficiently Active (12/17/2023)   Exercise Vital Sign    Days of Exercise per Week: 3 days    Minutes of Exercise per Session: 30 min  Stress: No Stress Concern Present (12/17/2023)   [employer] of Occupational Health - Occupational Stress Questionnaire    Feeling of Stress : Not at all  Social Connections:  Socially Integrated (12/17/2023)   Social Connection and Isolation Panel [NHANES]    Frequency of Communication with Friends and Family: More than three times a week    Frequency of Social Gatherings with Friends and Family: Once a week    Attends Religious Services: More than 4 times per year    Active Member of [employer] or Organizations: Yes    Attends Banker Meetings: 1 to 4 times per year    Marital Status: Married  Catering Manager Violence: Unknown (02/16/2022)   Received from [employer], [facility]   HITS    Physically Hurt: Not on file    Insult or Talk Down To: Not on file    Threaten Physical Harm: Not on file    Scream or Curse: Not on file    Outpatient Encounter Medications as of 12/17/2023  Medication Sig   amLODipine  (NORVASC ) 5 MG tablet TAKE 1 TABLET BY MOUTH DAILY   Clotrimazole  1 % OINT Apply 1 Application topically in the morning and at bedtime for 10 days.   losartan -hydrochlorothiazide (HYZAAR) 100-25 MG tablet Take 1 tablet by mouth daily.   meclizine  (ANTIVERT ) 12.5 MG tablet Take 1 tablet (12.5 mg total) by mouth 3 (three) times daily as needed for dizziness.   potassium chloride  SA (KLOR-CON  M) 20 MEQ tablet TAKE 2 TABLETS BY MOUTH EVERY DAY   No facility-administered encounter medications on file as of 12/17/2023.    No Known Allergies  Pertinent ROS per HPI, otherwise unremarkable      Objective:  BP 127/77   Pulse (!) 53   Temp (!) 97.4 F (36.3 C) (Temporal)   Ht 5' 7 (1.702 m)   Wt 232 lb 12.8 oz (105.6 kg)   LMP 08/14/2021   SpO2 99%   BMI 36.46 kg/m    Wt Readings from Last 3 Encounters:  12/17/23 232 lb 12.8 oz (105.6 kg)  10/02/23 235 lb (106.6 kg)  09/26/23 235 lb 4 oz (106.7 kg)    Physical Exam Vitals and nursing note reviewed.  Constitutional:      General: She is not in acute distress.    Appearance: Normal appearance. She is not ill-appearing, toxic-appearing or diaphoretic.  HENT:     Head: Normocephalic and  atraumatic.     Nose: Nose normal.     Mouth/Throat:     Mouth: Mucous membranes are moist.  Eyes:     Conjunctiva/sclera: Conjunctivae normal.     Pupils: Pupils are equal, round, and reactive to light.  Cardiovascular:     Rate and Rhythm: Normal rate.  Pulmonary:     Effort: Pulmonary effort is normal.  Musculoskeletal:     Cervical back: Neck supple.     Comments: Brace in left wrist  Skin:    General: Skin is warm and dry.     Capillary Refill: Capillary refill takes  less than 2 seconds.     Findings: Erythema and rash present.     Comments: Raised and annular with central clearing, image below.   Neurological:     General: No focal deficit present.     Mental Status: She is alert and oriented to person, place, and time.  Psychiatric:        Mood and Affect: Mood normal.        Behavior: Behavior normal.        Thought Content: Thought content normal.        Judgment: Judgment normal.     Results for orders placed or performed during the hospital encounter of 10/15/23  Glucose, CSF   Collection Time: 10/15/23  9:49 AM  Result Value Ref Range   Glucose, CSF 68 40 - 80 mg/dL  Protein, CSF   Collection Time: 10/15/23  9:49 AM  Result Value Ref Range   Total Protein, CSF 33 15 - 45 mg/dL  CSF cell count with differential   Collection Time: 10/15/23  9:49 AM  Result Value Ref Range   Color, CSF COLORLESS COLORLESS   Appearance, CSF CLEAR CLEAR   RBC Count, CSF 0 0 cells/uL   TOTAL NUCLEATED CELL 1 0 - 5 cells/uL   Monocyte/Macrophage CANCELED        Pertinent labs & imaging results that were available during my care of the patient were reviewed by me and considered in my medical decision making.  Assessment & Plan:  Diamon was seen today for rash.  Diagnoses and all orders for this visit:  Ringworm of body -     Clotrimazole  1 % OINT; Apply 1 Application topically in the morning and at bedtime for 10 days.     Assessment and Plan    Ashley  [name] Presents with a pruritic, ring-like lesion with central clearing, consistent with Ashley [name], likely contracted from her daughter's dog. Previous treatments with various creams and oral Benadryl were ineffective. Diagnosis based on lesion appearance and exposure history. Discussed treatment options including clotrimazole  and ketoconazole  creams. Explained clotrimazole  typically resolves the rash in about a week, with continued use for two additional days post-clearance to ensure eradication. If insurance does not cover clotrimazole , ketoconazole  will be prescribed. Topical Benadryl and cool compresses recommended for pruritus relief. - Prescribe clotrimazole  cream, apply BID for 7-10 days - Continue clotrimazole  cream for 2 additional days after the rash clears - If insurance does not cover clotrimazole , prescribe ketoconazole  cream - Use topical Benadryl for pruritus - Apply cool compresses for pruritus relief.          Continue all other maintenance medications.  Follow up plan: Return if symptoms worsen or fail to improve.   Continue healthy lifestyle choices, including diet (rich in fruits, vegetables, and lean proteins, and low in salt and simple carbohydrates) and exercise (at least 30 minutes of moderate physical activity daily).  Educational handout given for ring worm  The above assessment and management plan was discussed with the patient. The patient verbalized understanding of and has agreed to the management plan. Patient is aware to call the clinic if they develop any new symptoms or if symptoms persist or worsen. Patient is aware when to return to the clinic for a follow-up visit. Patient educated on when it is appropriate to go to the emergency department.   Rosaline Bruns, FNP-C [facility] [phone]

## 2023-12-17 NOTE — Addendum Note (Signed)
Addended by: Sonny Masters on: 12/17/2023 04:30 PM   Modules accepted: Orders

## 2023-12-17 NOTE — Telephone Encounter (Signed)
Copied from CRM [id number]. Topic: Clinical - Prescription Issue >> Dec 17, 2023  3:20 PM Ashley [name] wrote: Reason for CRM:  Ashley [name]- Clotrimazole 1 % OINT not covered by insurance, will need alternative

## 2023-12-17 NOTE — Telephone Encounter (Signed)
 Pt aware and verbalized understanding.

## 2024-01-10 ENCOUNTER — Other Ambulatory Visit: Payer: Self-pay

## 2024-01-10 ENCOUNTER — Encounter: Payer: BC Managed Care – PPO | Admitting: Family Medicine

## 2024-01-10 ENCOUNTER — Other Ambulatory Visit (HOSPITAL_COMMUNITY): Payer: Self-pay | Admitting: Nephrology

## 2024-01-10 ENCOUNTER — Ambulatory Visit (HOSPITAL_COMMUNITY)
Admission: RE | Admit: 2024-01-10 | Discharge: 2024-01-10 | Disposition: A | Discharge status: Home / Self Care | Payer: 59 | Source: Ambulatory Visit | Attending: Vascular Surgery | Admitting: Vascular Surgery

## 2024-01-10 DIAGNOSIS — I10 Essential (primary) hypertension: Secondary | ICD-10-CM

## 2024-01-12 ENCOUNTER — Other Ambulatory Visit: Payer: Self-pay | Admitting: Family Medicine

## 2024-01-12 DIAGNOSIS — E876 Hypokalemia: Secondary | ICD-10-CM

## 2024-01-17 ENCOUNTER — Ambulatory Visit (INDEPENDENT_AMBULATORY_CARE_PROVIDER_SITE_OTHER): Payer: BC Managed Care – PPO | Admitting: Family Medicine

## 2024-01-17 ENCOUNTER — Encounter: Payer: Self-pay | Admitting: Family Medicine

## 2024-01-17 VITALS — BP 136/76 | HR 50 | Temp 97.1°F | Ht 67.0 in | Wt 231.4 lb

## 2024-01-17 DIAGNOSIS — Z1231 Encounter for screening mammogram for malignant neoplasm of breast: Secondary | ICD-10-CM

## 2024-01-17 DIAGNOSIS — Z130 Encounter for screening for diseases of the blood and blood-forming organs and certain disorders involving the immune mechanism: Secondary | ICD-10-CM | POA: Diagnosis not present

## 2024-01-17 DIAGNOSIS — Z90710 Acquired absence of both cervix and uterus: Secondary | ICD-10-CM | POA: Insufficient documentation

## 2024-01-17 DIAGNOSIS — Z0000 Encounter for general adult medical examination without abnormal findings: Secondary | ICD-10-CM

## 2024-01-17 DIAGNOSIS — E876 Hypokalemia: Secondary | ICD-10-CM

## 2024-01-17 DIAGNOSIS — Z1211 Encounter for screening for malignant neoplasm of colon: Secondary | ICD-10-CM | POA: Diagnosis not present

## 2024-01-17 DIAGNOSIS — M7989 Other specified soft tissue disorders: Secondary | ICD-10-CM | POA: Insufficient documentation

## 2024-01-17 DIAGNOSIS — E559 Vitamin D deficiency, unspecified: Secondary | ICD-10-CM | POA: Insufficient documentation

## 2024-01-17 DIAGNOSIS — Z0001 Encounter for general adult medical examination with abnormal findings: Secondary | ICD-10-CM | POA: Diagnosis not present

## 2024-01-17 DIAGNOSIS — I10 Essential (primary) hypertension: Secondary | ICD-10-CM

## 2024-01-17 DIAGNOSIS — Z1212 Encounter for screening for malignant neoplasm of rectum: Secondary | ICD-10-CM

## 2024-01-17 MED ORDER — LOSARTAN POTASSIUM-HCTZ 100-25 MG PO TABS: 1.0000 | ORAL_TABLET | Freq: Every day | ORAL | 1 refills | Status: DC

## 2024-01-17 MED ORDER — POTASSIUM CHLORIDE CRYS ER 20 MEQ PO TBCR: 40.0000 meq | EXTENDED_RELEASE_TABLET | Freq: Every day | ORAL | 1 refills | Status: AC

## 2024-01-17 MED ORDER — AMLODIPINE BESYLATE 5 MG PO TABS: 5.0000 mg | ORAL_TABLET | Freq: Every day | ORAL | 1 refills | Status: DC

## 2024-01-17 NOTE — Progress Notes (Signed)
 Complete physical exam  Patient: Ashley [name]   DOB: 07-02-1976   48 y.o. Female  MRN: [id number]  Subjective:    Chief Complaint  Patient presents with   Annual Exam    Ashley [name] is a 48 y.o. female who presents today for a complete physical exam. She reports consuming a general diet. The patient does not participate in regular exercise at present. She generally feels well. She reports sleeping well. She does not have additional problems to discuss today.   History of Present Illness   The patient presents for an annual physical exam and to discuss colorectal cancer screening options.  She has been experiencing ongoing hand pain since April 2022. A permanent partial impairment was ruled out, and she is no longer receiving therapy. She has settled a work-related claim and is awaiting disability. She experiences daily pain in her hand.  She underwent a hysterectomy in 2022, which included the removal of her ovaries due to a cyst. She experiences severe night sweats and is unsure if this is related to her hysterectomy.  She reports joint aches and pains, particularly in her knees, and numbness in one leg. She also notes occasional swelling in her ankle and frequent itching. She is concerned about a possible blood clot but is unsure. No changes in bowel or bladder habits.  She experiences sleep disturbances and is currently taking melatonin to aid sleep. She has not had any falls and remains active.  She has no changes in hearing or vision, although she recently received a new prescription for her glasses. She is monitoring her kidney health by drinking water regularly.       Most recent fall risk assessment:    01/17/2024    9:05 AM  Fall Risk   Falls in the past year? 0  Risk for fall due to : No Fall Risks  Follow up Falls evaluation completed     Most recent depression screenings:    01/17/2024    9:05 AM 09/26/2023    3:45 PM  PHQ 2/9 Scores  PHQ - 2 Score  0 0  PHQ- 9 Score 0 0    Vision:Within last year and Dental: No current dental problems and Receives regular dental care  Patient Active Problem List   Diagnosis Date Noted   Localized swelling of lower extremity 01/17/2024   H/O abdominal hysterectomy 01/17/2024   Hypokalemia 01/16/2022   Hypertension 01/16/2022   Anemia due to chronic blood loss    Past Medical History:  Diagnosis Date   Hernia of abdominal cavity    Hypertension    Past Surgical History:  Procedure Laterality Date   CERVICAL CONIZATION W/BX N/A 09/06/2021   Procedure: ENDOCERVICAL CONIZATION;  Surgeon: Lazaro Arms, MD;  Location: [facility];  Service: Gynecology;  Laterality: N/A;   CHOLECYSTECTOMY  2011   HERNIA REPAIR     umbilical hernia   SALPINGOOPHORECTOMY Right 09/06/2021   Procedure: OPEN RIGHT SALPINGO OOPHORECTOMY;  Surgeon: Lazaro Arms, MD;  Location: [facility];  Service: Gynecology;  Laterality: Right;   SUPRACERVICAL ABDOMINAL HYSTERECTOMY N/A 09/06/2021   Procedure: HYSTERECTOMY SUPRACERVICAL ABDOMINAL;  Surgeon: Lazaro Arms, MD;  Location: [facility];  Service: Gynecology;  Laterality: N/A;   TUBAL LIGATION  2009   UNILATERAL SALPINGECTOMY Left 09/06/2021   Procedure: LEFT SALPINGECTOMY;  Surgeon: Lazaro Arms, MD;  Location: [facility];  Service: Gynecology;  Laterality: Left;   Social History   Tobacco Use   Smoking  status: Never   Smokeless tobacco: Never  Vaping Use   Vaping status: Never Used  Substance Use Topics   Alcohol use: Not Currently   Drug use: Never   Social History   Socioeconomic History   Marital status: Married    Spouse name: Minerva Areola   Number of children: 3   Years of education: Not on file   Highest education level: Some college, no degree  Occupational History   Not on file  Tobacco Use   Smoking status: Never   Smokeless tobacco: Never  Vaping Use   Vaping status: Never Used  Substance and Sexual Activity   Alcohol use: Not Currently   Drug use: Never    Sexual activity: Not Currently    Birth control/protection: Surgical    Comment: tubal/hyst  Other Topics Concern   Not on file  Social History Narrative   Married since June 2004.Lives with husband and 3 girls.Works as Stage manager.   Right handed   Caffiene- none   Social Drivers of Health   Financial Resource Strain: Medium Risk (12/17/2023)   Overall Financial Resource Strain (CARDIA)    Difficulty of Paying Living Expenses: Somewhat hard  Food Insecurity: Food Insecurity Present (12/17/2023)   Hunger Vital Sign    Worried About Running Out of Food in the Last Year: Sometimes true    Ran Out of Food in the Last Year: Never true  Transportation Needs: No Transportation Needs (12/17/2023)   PRAPARE - Administrator, Civil Service (Medical): No    Lack of Transportation (Non-Medical): No  Physical Activity: Insufficiently Active (12/17/2023)   Exercise Vital Sign    Days of Exercise per Week: 3 days    Minutes of Exercise per Session: 30 min  Stress: No Stress Concern Present (12/17/2023)   [employer] of Occupational Health - Occupational Stress Questionnaire    Feeling of Stress : Not at all  Social Connections: Socially Integrated (12/17/2023)   Social Connection and Isolation Panel [NHANES]    Frequency of Communication with Friends and Family: More than three times a week    Frequency of Social Gatherings with Friends and Family: Once a week    Attends Religious Services: More than 4 times per year    Active Member of [employer] or Organizations: Yes    Attends Banker Meetings: 1 to 4 times per year    Marital Status: Married  Catering manager Violence: Unknown (02/16/2022)   Received from [employer], [facility]   HITS    Physically Hurt: Not on file    Insult or Talk Down To: Not on file    Threaten Physical Harm: Not on file    Scream or Curse: Not on file   Family Status  Relation Name Status   Mother  Alive   Father  Alive   Sister   Alive   Brother  Alive   Daughter Chelsea 21 Alive   Daughter [employer] 16 Alive   Daughter Chalia 12 Alive   MGM  Alive   MGF  Deceased   PGM hazel Alive   PGF  Deceased  No partnership data on file   Family History  Problem Relation Age of Onset   Hypertension Mother    Cancer Mother        skin related / graft   CVA Father    Crohn's disease Father    Heart murmur Sister    Hypertension Maternal Grandmother    Diabetes Maternal  Grandmother    Dementia Maternal Grandfather    Pneumonia Paternal Grandfather    No Known Allergies    Patient Care Team: Makayah Pauli, Doralee Albino, FNP as PCP - General (Family Medicine) Johnn Hai, NP as Nurse Practitioner (Nurse Practitioner)   Outpatient Medications Prior to Visit  Medication Sig   ketoconazole (NIZORAL) 2 % cream Apply 1 Application topically daily.   meclizine (ANTIVERT) 12.5 MG tablet Take 1 tablet (12.5 mg total) by mouth 3 (three) times daily as needed for dizziness.   [DISCONTINUED] amLODipine (NORVASC) 5 MG tablet TAKE 1 TABLET BY MOUTH DAILY   [DISCONTINUED] losartan-hydrochlorothiazide (HYZAAR) 100-25 MG tablet Take 1 tablet by mouth daily.   [DISCONTINUED] potassium chloride SA (KLOR-CON M) 20 MEQ tablet TAKE 2 TABLETS BY MOUTH EVERY DAY   No facility-administered medications prior to visit.    ROS per HPI      Objective:     BP 136/76   Pulse (!) 50   Temp (!) 97.1 F (36.2 C)   Ht 5\' 7"  (1.702 m)   Wt 231 lb 6.4 oz (105 kg)   LMP 08/14/2021   SpO2 97%   BMI 36.24 kg/m  BP Readings from Last 3 Encounters:  01/17/24 136/76  12/17/23 127/77  10/15/23 (!) 143/71   Wt Readings from Last 3 Encounters:  01/17/24 231 lb 6.4 oz (105 kg)  12/17/23 232 lb 12.8 oz (105.6 kg)  10/02/23 235 lb (106.6 kg)   SpO2 Readings from Last 3 Encounters:  01/17/24 97%  12/17/23 99%  09/26/23 97%      Physical Exam Vitals and nursing note reviewed.  Constitutional:      Appearance: Normal appearance. She is obese.   HENT:     Head: Normocephalic and atraumatic.     Right Ear: Tympanic membrane, ear canal and external ear normal.     Left Ear: Tympanic membrane, ear canal and external ear normal.     Nose: Nose normal.     Mouth/Throat:     Mouth: Mucous membranes are moist.     Pharynx: Oropharynx is clear.  Eyes:     Extraocular Movements: Extraocular movements intact.     Conjunctiva/sclera: Conjunctivae normal.     Pupils: Pupils are equal, round, and reactive to light.  Cardiovascular:     Rate and Rhythm: Regular rhythm.     Heart sounds: Normal heart sounds.  Pulmonary:     Effort: Pulmonary effort is normal.     Breath sounds: Normal breath sounds.  Abdominal:     General: Bowel sounds are normal.     Palpations: Abdomen is soft.     Tenderness: There is no abdominal tenderness.  Musculoskeletal:     Cervical back: Normal range of motion and neck supple.     Comments: Brace on left hand  Skin:    General: Skin is warm and dry.     Capillary Refill: Capillary refill takes less than 2 seconds.  Neurological:     General: No focal deficit present.     Mental Status: She is alert and oriented to person, place, and time.  Psychiatric:        Mood and Affect: Mood normal.        Behavior: Behavior normal.        Thought Content: Thought content normal.        Judgment: Judgment normal.       Last CBC Lab Results  Component Value Date   WBC 7.3 09/26/2023  HGB 12.0 09/26/2023   HCT 38.2 09/26/2023   MCV 87 09/26/2023   MCH 27.4 09/26/2023   RDW 13.1 09/26/2023   PLT 336 09/26/2023   Last metabolic panel Lab Results  Component Value Date   GLUCOSE 82 09/26/2023   NA 140 09/26/2023   K 3.8 09/26/2023   CL 98 09/26/2023   CO2 26 09/26/2023   BUN 10 09/26/2023   CREATININE 1.17 (H) 09/26/2023   EGFR 59 (L) 09/26/2023   CALCIUM 10.0 09/26/2023   PROT 8.3 09/26/2023   ALBUMIN 4.7 09/26/2023   LABGLOB 3.6 09/26/2023   AGRATIO 1.4 01/16/2022   BILITOT 0.8 09/26/2023    ALKPHOS 95 09/26/2023   AST 25 09/26/2023   ALT 15 09/26/2023   ANIONGAP 9 09/22/2023   Last lipids Lab Results  Component Value Date   CHOL 168 05/28/2023   HDL 40 05/28/2023   LDLCALC 114 (H) 05/28/2023   TRIG 71 05/28/2023   CHOLHDL 4.2 05/28/2023   Last hemoglobin A1c Lab Results  Component Value Date   HGBA1C 5.4 09/26/2023   Last thyroid functions Lab Results  Component Value Date   TSH 1.350 09/26/2023   T4TOTAL 6.9 09/26/2023   Last vitamin D Lab Results  Component Value Date   VD25OH 28.0 (L) 05/28/2023   Last vitamin B12 and Folate Lab Results  Component Value Date   VITAMINB12 428 05/28/2023   FOLATE 6.2 05/28/2023        Assessment & Plan:    Routine Health Maintenance and Physical Exam  Immunization History  Administered Date(s) Administered   Influenza,inj,Quad PF,6+ Mos 09/25/2020   Influenza-Unspecified 08/12/2021   Moderna Sars-Covid-2 Vaccination 01/12/2020, 02/09/2020    Health Maintenance  Topic Date Due   Fecal DNA (Cologuard)  Never done   COVID-19 Vaccine (3 - 2024-25 season) 02/02/2024 (Originally 07/14/2023)   INFLUENZA VACCINE  02/10/2024 (Originally 06/13/2023)   DTaP/Tdap/Td (1 - Tdap) 09/25/2024 (Originally 09/10/1997)   Pneumococcal Vaccine 60-3 Years old (1 of 2 - PCV) 01/16/2025 (Originally 09/10/1984)   Hepatitis C Screening  01/16/2025 (Originally 09/10/1996)   HIV Screening  01/16/2025 (Originally 09/10/1993)   Cervical Cancer Screening (HPV/Pap Cotest)  05/16/2026   HPV VACCINES  Aged Out    Discussed health benefits of physical activity, and encouraged her to engage in regular exercise appropriate for her age and condition.  Problem List Items Addressed This Visit       Cardiovascular and Mediastinum   Hypertension   Relevant Medications   amLODipine (NORVASC) 5 MG tablet   losartan-hydrochlorothiazide (HYZAAR) 100-25 MG tablet   Other Relevant Orders   Anemia Profile B   CMP14+EGFR   Lipid panel    Thyroid Panel With TSH     Other   Hypokalemia   Relevant Medications   potassium chloride SA (KLOR-CON M) 20 MEQ tablet   Other Relevant Orders   CMP14+EGFR   Localized swelling of lower extremity   Relevant Medications   amLODipine (NORVASC) 5 MG tablet   losartan-hydrochlorothiazide (HYZAAR) 100-25 MG tablet   Other Relevant Orders   CMP14+EGFR   H/O abdominal hysterectomy   Other Visit Diagnoses       Annual physical exam    -  Primary   Relevant Orders   MM 3D SCREENING MAMMOGRAM BILATERAL BREAST   Cologuard   Anemia Profile B   CMP14+EGFR   Lipid panel   Thyroid Panel With TSH   VITAMIN D 25 Hydroxy (Vit-D Deficiency, Fractures)     Screening  for colorectal cancer       Relevant Orders   Cologuard     Encounter for screening mammogram for malignant neoplasm of breast       Relevant Orders   MM 3D SCREENING MAMMOGRAM BILATERAL BREAST     Vitamin D deficiency       Relevant Orders   CMP14+EGFR   VITAMIN D 25 Hydroxy (Vit-D Deficiency, Fractures)     Screening for deficiency anemia       Relevant Orders   Anemia Profile B     Assessment and Plan    Chronic Hand Pain Chronic hand pain since April 2022 due to work-related injury leading to partial permanent impairment. Pain persists daily. Awaiting disability. - Consider self-initiated therapy if needed  Joint Pain and Numbness Reports knee pain, leg numbness, and occasional ankle swelling. Differential includes B12 deficiency. Explained that B12 deficiency can cause paresthesias, numbness, and tingling. - Order B12 level  Insomnia Reports difficulty sleeping. Currently taking melatonin. Advised to increase melatonin up to 20 mg nightly as needed. - Increase melatonin up to 20 mg nightly as needed  Post-Hysterectomy Symptoms Had a hysterectomy in 2022, including removal of ovaries. Reports severe night sweats. Discussed trying black cohosh for night sweats. If ineffective, consider hormone therapy with  GYN. - Recommend black cohosh for night sweats - Discuss hormone therapy with GYN if black cohosh is ineffective  Colorectal Cancer Screening Due for colorectal cancer screening. No family history of colorectal cancer. Discussed options including colonoscopy and Cologuard. Explained that Cologuard involves collecting a stool sample to test for blood and DNA markers of colorectal cancer. If positive, follow-up with colonoscopy is required. Patient opted for Cologuard. - Order Cologuard test  Breast Cancer Screening Had a mammogram last year. Mammograms recommended yearly. Last mammogram done at [facility] in [place]. - Schedule annual mammogram  General Health Maintenance Generally in good health. Regular eye doctor visits with a recent prescription change. Monitoring kidney function and drinking water adequately. - Update lab work - Check potassium level - Ensure regular eye exams  Follow-up - Follow up with GYN for night sweats and potential hormone therapy - Schedule next physical exam in one year.       Return in about 1 year (around 01/16/2025) for Annual Physical.     Kari Baars, FNP

## 2024-01-17 NOTE — Patient Instructions (Signed)
 Black cohosh

## 2024-01-18 LAB — ANEMIA PROFILE B
Basophils Absolute: 0 10*3/uL (ref 0.0–0.2)
Basos: 1 %
EOS (ABSOLUTE): 0.1 10*3/uL (ref 0.0–0.4)
Eos: 1 %
Ferritin: 63 ng/mL (ref 15–150)
Folate: 10.3 ng/mL (ref >3.0)
Hematocrit: 36.5 % (ref 34.0–46.6)
Hemoglobin: 11.7 g/dL (ref 11.1–15.9)
Immature Grans (Abs): 0 10*3/uL (ref 0.0–0.1)
Immature Granulocytes: 0 %
Iron Saturation: 23 % (ref 15–55)
Iron: 59 ug/dL (ref 27–159)
Lymphocytes Absolute: 1.7 10*3/uL (ref 0.7–3.1)
Lymphs: 31 %
MCH: 27.9 pg (ref 26.6–33.0)
MCHC: 32.1 g/dL (ref 31.5–35.7)
MCV: 87 fL (ref 79–97)
Monocytes Absolute: 0.3 10*3/uL (ref 0.1–0.9)
Monocytes: 6 %
Neutrophils Absolute: 3.3 10*3/uL (ref 1.4–7.0)
Neutrophils: 61 %
Platelets: 293 10*3/uL (ref 150–450)
RBC: 4.2 x10E6/uL (ref 3.77–5.28)
RDW: 13.1 % (ref 11.7–15.4)
Retic Ct Pct: 1.9 % (ref 0.6–2.6)
Total Iron Binding Capacity: 259 ug/dL (ref 250–450)
UIBC: 200 ug/dL (ref 131–425)
Vitamin B-12: 460 pg/mL (ref 232–1245)
WBC: 5.4 10*3/uL (ref 3.4–10.8)

## 2024-01-18 LAB — CMP14+EGFR
ALT: 9 IU/L (ref 0–32)
AST: 16 IU/L (ref 0–40)
Albumin: 4.3 g/dL (ref 3.9–4.9)
Alkaline Phosphatase: 80 IU/L (ref 44–121)
BUN/Creatinine Ratio: 11 (ref 9–23)
BUN: 12 mg/dL (ref 6–24)
Bilirubin Total: 0.7 mg/dL (ref 0.0–1.2)
CO2: 26 mmol/L (ref 20–29)
Calcium: 9.5 mg/dL (ref 8.7–10.2)
Chloride: 100 mmol/L (ref 96–106)
Creatinine, Ser: 1.11 mg/dL — ABNORMAL HIGH (ref 0.57–1.00)
Globulin, Total: 3.3 g/dL (ref 1.5–4.5)
Glucose: 101 mg/dL — ABNORMAL HIGH (ref 70–99)
Potassium: 3.8 mmol/L (ref 3.5–5.2)
Sodium: 142 mmol/L (ref 134–144)
Total Protein: 7.6 g/dL (ref 6.0–8.5)
eGFR: 62 mL/min/{1.73_m2} (ref >59)

## 2024-01-18 LAB — THYROID PANEL WITH TSH
Free Thyroxine Index: 2.2 (ref 1.2–4.9)
T3 Uptake Ratio: 29 % (ref 24–39)
T4, Total: 7.7 ug/dL (ref 4.5–12.0)
TSH: 2.95 u[IU]/mL (ref 0.450–4.500)

## 2024-01-18 LAB — VITAMIN D 25 HYDROXY (VIT D DEFICIENCY, FRACTURES): Vit D, 25-Hydroxy: 31 ng/mL (ref 30.0–100.0)

## 2024-01-18 LAB — LIPID PANEL
Chol/HDL Ratio: 3.9 ratio (ref 0.0–4.4)
Cholesterol, Total: 156 mg/dL (ref 100–199)
HDL: 40 mg/dL (ref >39)
LDL Chol Calc (NIH): 103 mg/dL — ABNORMAL HIGH (ref 0–99)
Triglycerides: 65 mg/dL (ref 0–149)
VLDL Cholesterol Cal: 13 mg/dL (ref 5–40)

## 2024-01-19 ENCOUNTER — Encounter: Payer: Self-pay | Admitting: Family Medicine

## 2024-01-27 ENCOUNTER — Ambulatory Visit: Payer: Self-pay | Admitting: Adult Health

## 2024-01-27 ENCOUNTER — Encounter: Payer: Self-pay | Admitting: Adult Health

## 2024-01-27 VITALS — BP 133/78 | HR 58 | Ht 67.0 in | Wt 236.0 lb

## 2024-01-27 DIAGNOSIS — G4719 Other hypersomnia: Secondary | ICD-10-CM

## 2024-01-27 DIAGNOSIS — I10 Essential (primary) hypertension: Secondary | ICD-10-CM | POA: Diagnosis not present

## 2024-01-27 DIAGNOSIS — R0683 Snoring: Secondary | ICD-10-CM

## 2024-01-27 NOTE — Patient Instructions (Signed)
Set up for home sleep study  ?Work on healthy weight loss.  ?Do not drive if sleepy  ?Follow up in 6 weeks and As needed   ? ?

## 2024-01-27 NOTE — Progress Notes (Signed)
 @Patient  ID: Ashley [name], female    DOB: 1976-03-14, 49 y.o.   MRN: [id number]  Chief Complaint  Patient presents with   Consult    Referring provider: Johnn Hai, NP  HPI: 48 year old female seen for sleep consult January 27, 2024 for snoring, daytime sleepiness, restless sleep, in the setting of difficult to control hypertension and chronic kidney disease  TEST/EVENTS :   01/27/2024 Sleep consult  Presents for sleep consult today.  Kindly referred by Dr. Wolfgang Phoenix.  Patient has been following with nephrology for difficult to control hypertension and chronic kidney disease.  She has been sent here for evaluation of possible sleep apnea.  She has snoring, restless sleep, daytime sleepiness.  Typically goes to bed about 11 PM.  Takes up to 30 minutes to go to sleep.  Is up 3 times each night.  Gets up about 7 AM.  Weight is up a few pounds current weight is at 236 pounds with a BMI at 36.  Patient has never had a sleep study before.  She says that she does nap during the daytime typically not intentionally but if she sits down she falls asleep easily.  She does not use sleep aids but has been recommended to try melatonin because her sleep is so restless.  She wakes up with morning headaches.  She has no history of congestive heart failure or stroke.  She has no removable dental work.  Epworth score is 8 out of 24.  Typically gets sleepy if she sits down to watch TV, rest, afternoon hours and after eating lunch.  And also as a passenger in the car. No symptoms suspicious for cataplexy or sleep paralysis.  She has a history of hypertension and chronic kidney disease that has been difficult to control. She is currently taking Norvasc, Hyzaar,  for her blood pressure management.   SH:  Socially, she does not smoke, drink alcohol, or use drugs. She is married with three daughters, two of whom live with her. She is currently on disability due to a hand injury sustained in 2022, which resulted  in contractures of three fingers. Married.     FH : none   Surgical  Past Surgical History:  Procedure Laterality Date   CERVICAL CONIZATION W/BX N/A 09/06/2021   Procedure: ENDOCERVICAL CONIZATION;  Surgeon: Lazaro Arms, MD;  Location: [facility];  Service: Gynecology;  Laterality: N/A;   CHOLECYSTECTOMY  2011   HERNIA REPAIR     umbilical hernia   SALPINGOOPHORECTOMY Right 09/06/2021   Procedure: OPEN RIGHT SALPINGO OOPHORECTOMY;  Surgeon: Lazaro Arms, MD;  Location: [facility];  Service: Gynecology;  Laterality: Right;   SUPRACERVICAL ABDOMINAL HYSTERECTOMY N/A 09/06/2021   Procedure: HYSTERECTOMY SUPRACERVICAL ABDOMINAL;  Surgeon: Lazaro Arms, MD;  Location: [facility];  Service: Gynecology;  Laterality: N/A;   TUBAL LIGATION  2009   UNILATERAL SALPINGECTOMY Left 09/06/2021   Procedure: LEFT SALPINGECTOMY;  Surgeon: Lazaro Arms, MD;  Location: [facility];  Service: Gynecology;  Laterality: Left;      No Known Allergies  Immunization History  Administered Date(s) Administered   Influenza,inj,Quad PF,6+ Mos 09/25/2020   Influenza-Unspecified 08/12/2021   Moderna Sars-Covid-2 Vaccination 01/12/2020, 02/09/2020    Past Medical History:  Diagnosis Date   Hernia of abdominal cavity    Hypertension     Tobacco History: Social History   Tobacco Use  Smoking Status Never  Smokeless Tobacco Never   Counseling given: Not Answered   Outpatient Medications Prior to  Visit  Medication Sig Dispense Refill   amLODipine (NORVASC) 5 MG tablet Take 1 tablet (5 mg total) by mouth daily. 90 tablet 1   ketoconazole (NIZORAL) 2 % cream Apply 1 Application topically daily. 30 g 0   losartan-hydrochlorothiazide (HYZAAR) 100-25 MG tablet Take 1 tablet by mouth daily. 90 tablet 1   meclizine (ANTIVERT) 12.5 MG tablet Take 1 tablet (12.5 mg total) by mouth 3 (three) times daily as needed for dizziness. 30 tablet 0   potassium chloride SA (KLOR-CON M) 20 MEQ tablet Take 2 tablets (40 mEq  total) by mouth daily. 90 tablet 1   No facility-administered medications prior to visit.     Review of Systems:   Constitutional:   No  weight loss, night sweats,  Fevers, chills, fatigue, or  lassitude.  HEENT:   No headaches,  Difficulty swallowing,  Tooth/dental problems, or  Sore throat,                No sneezing, itching, ear ache, nasal congestion, post nasal drip,   CV:  No chest pain,  Orthopnea, PND, swelling in lower extremities, anasarca, dizziness, palpitations, syncope.   GI  No heartburn, indigestion, abdominal pain, nausea, vomiting, diarrhea, change in bowel habits, loss of appetite, bloody stools.   Resp: No shortness of breath with exertion or at rest.  No excess mucus, no productive cough,  No non-productive cough,  No coughing up of blood.  No change in color of mucus.  No wheezing.  No chest wall deformity  Skin: no rash or lesions.  GU: no dysuria, change in color of urine, no urgency or frequency.  No flank pain, no hematuria   MS:  No joint pain or swelling.  No decreased range of motion.  No back pain.    Physical Exam  BP 133/78   Pulse (!) 58   Ht 5\' 7"  (1.702 m)   Wt 236 lb (107 kg)   LMP 08/14/2021   SpO2 96% Comment: room air  BMI 36.96 kg/m   GEN: A/Ox3; pleasant , NAD, well nourished    HEENT:  [place]/AT,  EACs-clear, TMs-wnl, NOSE-clear, THROAT-clear, no lesions, no postnasal drip or exudate noted.   NECK:  Supple w/ fair ROM; no JVD; normal carotid impulses w/o bruits; no thyromegaly or nodules palpated; no lymphadenopathy.    RESP  Clear  P & A; w/o, wheezes/ rales/ or rhonchi. no accessory muscle use, no dullness to percussion  CARD:  RRR, no m/r/g, no peripheral edema, pulses intact, no cyanosis or clubbing.  GI:   Soft & nt; nml bowel sounds; no organomegaly or masses detected.   Musco: Warm bil, no deformities or joint swelling noted.   Neuro: alert, no focal deficits noted.    Skin: Warm, no lesions or rashes    Lab  Results:  CBC    Component Value Date/Time   WBC 5.4 01/17/2024 0929   WBC 8.2 09/22/2023 1801   RBC 4.20 01/17/2024 0929   RBC 4.39 09/22/2023 1801   HGB 11.7 01/17/2024 0929   HCT 36.5 01/17/2024 0929   PLT 293 01/17/2024 0929   MCV 87 01/17/2024 0929   MCH 27.9 01/17/2024 0929   MCH 27.3 09/22/2023 1801   MCHC 32.1 01/17/2024 0929   MCHC 30.9 09/22/2023 1801   RDW 13.1 01/17/2024 0929   LYMPHSABS 1.7 01/17/2024 0929   MONOABS 0.5 09/22/2023 1801   EOSABS 0.1 01/17/2024 0929   BASOSABS 0.0 01/17/2024 0929    BMET  Component Value Date/Time   NA 142 01/17/2024 0929   K 3.8 01/17/2024 0929   CL 100 01/17/2024 0929   CO2 26 01/17/2024 0929   GLUCOSE 101 (H) 01/17/2024 0929   GLUCOSE 106 (H) 09/22/2023 1801   BUN 12 01/17/2024 0929   CREATININE 1.11 (H) 01/17/2024 0929   CREATININE 1.02 04/24/2021 1325   CALCIUM 9.5 01/17/2024 0929   GFRNONAA >60 09/22/2023 1801   GFRNONAA 68 04/24/2021 1325   GFRAA 79 04/24/2021 1325    BNP    Component Value Date/Time   BNP <2.5 05/28/2023 0915    ProBNP No results found for: "PROBNP"  Imaging: VAS US RENAL ARTERY DUPLEX Result Date: 01/10/2024 ABDOMINAL VISCERAL Patient Name:  MIOSHA BEHE  Date of Exam:   01/10/2024 Medical Rec #: [id number]           Accession #:    [id number] Date of Birth: [birth year]          Patient Gender: F Patient Age:   85 years Exam Location:  [facility] Procedure:      VAS US RENAL ARTERY DUPLEX Referring Phys: [id number] MANPREET S BHUTANI -------------------------------------------------------------------------------- Indications: Hypertension High Risk Factors: Hypertension. Other Factors: CKD. Performing Technologist: Criss Rosales RVT  Examination Guidelines: A complete evaluation includes B-mode imaging, spectral Doppler, color Doppler, and power Doppler as needed of all accessible portions of each vessel. Bilateral testing is considered an integral part of a complete examination.  Limited examinations for reoccurring indications may be performed as noted.  Duplex Findings: +------------------+--------+--------+-------+ Right Renal ArteryPSV cm/sEDV cm/sComment +------------------+--------+--------+-------+ Origin               80      16           +------------------+--------+--------+-------+ Proximal            104      32           +------------------+--------+--------+-------+ Mid                 112      25           +------------------+--------+--------+-------+ Distal               72      21           +------------------+--------+--------+-------+ +-----------------+--------+--------+-------+ Left Renal ArteryPSV cm/sEDV cm/sComment +-----------------+--------+--------+-------+ Origin              49      12           +-----------------+--------+--------+-------+ Proximal            39      10           +-----------------+--------+--------+-------+ Mid                 40      10           +-----------------+--------+--------+-------+ Distal              43      10           +-----------------+--------+--------+-------+ +------------+--------+--------+----+-----------+--------+--------+----+ Right KidneyPSV cm/sEDV cm/sRI  Left KidneyPSV cm/sEDV cm/sRI   +------------+--------+--------+----+-----------+--------+--------+----+ Upper Pole  24      5       0.79Upper Pole 24      6       0.75 +------------+--------+--------+----+-----------+--------+--------+----+ Mid         30  7       0.        19      5       0.74 +------------+--------+--------+----+-----------+--------+--------+----+ Lower Pole  27      6       0.78Lower Pole 22      6       0.73 +------------+--------+--------+----+-----------+--------+--------+----+ +------------------+-----+------------------+-----+ Right Kidney           Left Kidney             +------------------+-----+------------------+-----+ RAR                     RAR                     +------------------+-----+------------------+-----+ RAR (manual)      1.16 RAR (manual)      0.55  +------------------+-----+------------------+-----+ Cortex                 Cortex                  +------------------+-----+------------------+-----+ Cortex thickness       Corex thickness         +------------------+-----+------------------+-----+ Kidney length (cm)10.50Kidney length (cm)11.00 +------------------+-----+------------------+-----+  Summary: Renal:  Right: Normal size right kidney. No evidence of right renal artery        stenosis. Normal right Resisitive Index. RRV flow present. Left:  Normal size of left kidney. No evidence of left renal artery        stenosis. Normal left Resistive Index. LRV flow present.  *See table(s) above for measurements and observations.  Diagnosing physician: Carolynn Sayers  Electronically signed by Carolynn Sayers on 01/10/2024 at 1:45:13 PM.    Final     Administration History     None           No data to display          No results found for: "NITRICOXIDE"      Assessment & Plan:  Assessment and Plan    Suspected Obstructive Sleep Apnea (OSA)   She presents with symptoms of snoring, daytime somnolence, morning headaches, and nocturnal gasping. Her BMI of 36 is a risk factor. Untreated OSA can lead to refractory hypertension, renal failure, and cardiovascular complications.  Patient education on Sleep apnea given  - discussed how weight can impact sleep and risk for sleep disordered breathing - discussed options to assist with weight loss: combination of diet modification, cardiovascular and strength training exercises   - had an extensive discussion regarding the adverse health consequences related to untreated sleep disordered breathing - specifically discussed the risks for hypertension, coronary artery disease, cardiac dysrhythmias, cerebrovascular disease, and diabetes - lifestyle  modification discussed   - discussed how sleep disruption can increase risk of accidents, particularly when driving - safe driving practices were discussed    Order a home sleep study through [facility]. Schedule a follow-up in six weeks to review sleep study results and discuss treatment options if sleep apnea is detected , including CPAP, mandibular advancement device, and lifestyle modifications with weight loss.   Hypertension    Difficult to control Hypertension - Evaluate for possible sleep apnea. Continue on current regimen and follow up with PCP/Renal   Chronic Kidney Disease (CKD)   Continue follow up with PCP/Renal  Obesity   Her BMI of 36 indicates obesity, a risk factor for OSA and hypertension.  Discuss weight loss strategies, including diet and exercise.  Rubye Oaks, NP 01/27/2024

## 2024-02-04 LAB — COLOGUARD: COLOGUARD: NEGATIVE

## 2024-04-20 ENCOUNTER — Telehealth: Payer: Self-pay

## 2024-04-20 NOTE — Telephone Encounter (Signed)
 Called and LVM to pt regarding to reschedule appt due to no sleep study

## 2024-04-21 ENCOUNTER — Encounter: Payer: Self-pay | Admitting: Adult Health

## 2024-04-21 ENCOUNTER — Ambulatory Visit: Admitting: Adult Health

## 2024-06-30 ENCOUNTER — Encounter: Payer: Self-pay | Admitting: Family Medicine

## 2024-07-02 ENCOUNTER — Ambulatory Visit: Admitting: Family Medicine

## 2024-07-02 ENCOUNTER — Telehealth: Payer: Self-pay | Admitting: Family Medicine

## 2024-07-02 DIAGNOSIS — I10 Essential (primary) hypertension: Secondary | ICD-10-CM

## 2024-07-02 DIAGNOSIS — M7989 Other specified soft tissue disorders: Secondary | ICD-10-CM

## 2024-07-02 MED ORDER — LOSARTAN POTASSIUM-HCTZ 100-25 MG PO TABS
1.0000 | ORAL_TABLET | Freq: Every day | ORAL | 1 refills | Status: AC
Start: 2024-07-02 — End: ?

## 2024-07-02 NOTE — Telephone Encounter (Signed)
  Prescription Request  07/02/2024  Is this a Controlled Substance medicine? No  Have you seen your PCP in the last 2 weeks? No pt was scheduled for appt for 08/21 but had to cancel she has to go to a funeral and she is out of this medication and has appt on 07/29/2024  If YES, route message to pool  -  If NO, patient needs to be scheduled for appointment.  What is the name of the medication or equipment? losartan -hydrochlorothiazide (HYZAAR   Have you contacted your pharmacy to request a refill? no   Which pharmacy would you like this sent to? [facility]   Patient notified that their request is being sent to the clinical staff for review and that they should receive a response within 2 business days.

## 2024-07-07 ENCOUNTER — Encounter: Payer: Self-pay | Admitting: *Deleted

## 2024-07-29 ENCOUNTER — Ambulatory Visit: Admitting: Family Medicine

## 2024-07-30 ENCOUNTER — Encounter: Payer: Self-pay | Admitting: Family Medicine

## 2024-07-30 ENCOUNTER — Ambulatory Visit: Admitting: Family Medicine

## 2024-08-26 ENCOUNTER — Encounter: Payer: Self-pay | Admitting: Family Medicine

## 2024-08-26 ENCOUNTER — Ambulatory Visit: Admitting: Family Medicine

## 2024-08-26 VITALS — BP 132/80 | HR 58 | Temp 98.0°F | Ht 67.0 in | Wt 240.6 lb

## 2024-08-26 DIAGNOSIS — N1831 Chronic kidney disease, stage 3a: Secondary | ICD-10-CM | POA: Insufficient documentation

## 2024-08-26 DIAGNOSIS — I10 Essential (primary) hypertension: Secondary | ICD-10-CM | POA: Diagnosis not present

## 2024-08-26 DIAGNOSIS — M79642 Pain in left hand: Secondary | ICD-10-CM

## 2024-08-26 DIAGNOSIS — G8929 Other chronic pain: Secondary | ICD-10-CM | POA: Diagnosis not present

## 2024-08-26 MED ORDER — PREDNISONE 20 MG PO TABS: 40.0000 mg | ORAL_TABLET | Freq: Every day | ORAL | 0 refills | Status: AC

## 2024-08-26 MED ORDER — AMLODIPINE BESYLATE 5 MG PO TABS: 5.0000 mg | ORAL_TABLET | Freq: Every day | ORAL | 1 refills | Status: AC

## 2024-08-26 NOTE — Progress Notes (Signed)
 Subjective:  Patient ID: Ashley [name], female    DOB: 03-Jan-1976, 48 y.o.   MRN: [id number]  Patient Care Team: Severa Rock CHRISTELLA, FNP as PCP - General (Family Medicine) Delsie Annabella, NP as Nurse Practitioner (Nurse Practitioner) Orlie Madelin RAMAN, NP as Nurse Practitioner (Pulmonary Disease)   Chief Complaint:  Hand Pain (Left hand pain since injury in 2022) and Hypertension   HPI: Ashley [name] is a 48 y.o. female presenting on 08/26/2024 for Hand Pain (Left hand pain since injury in 2022) and Hypertension   Ashley [name] is a 48 year old female with hypertension who presents with hand pain and medication refill.  She has been out of her amlodipine  for about a week and has been monitoring her blood pressure, which has been fluctuating but not excessively high. Occasional headaches are noted. No chest pain or leg swelling. She continues to take lisinopril.  She describes ongoing pain and a 'shocking' sensation in her hand following an injury in 2022. There is no functional use of the hand, and she finds the situation frustrating. She last saw a specialist in 2023, but her case was closed. She has been using Tylenol  for pain management due to concerns about her kidney function and has tried topical treatments provided by her mother. She sometimes uses compression for relief.  She has a history of kidney issues, with her last lab work showing improved creatinine levels at 0.96 mg/dL and a GFR of 74 fO/fpw/8.26f. No changes in urine output or swelling. Her last nephrology visit was a few months ago, with a follow-up scheduled in six months.          Relevant past medical, surgical, family, and social history reviewed and updated as indicated.  Allergies and medications reviewed and updated. Data reviewed: Chart in Epic.   Past Medical History:  Diagnosis Date   Hernia of abdominal cavity    Hypertension     Past Surgical History:  Procedure Laterality Date    CERVICAL CONIZATION W/BX N/A 09/06/2021   Procedure: ENDOCERVICAL CONIZATION;  Surgeon: Jayne Vonn DEL, MD;  Location: [facility];  Service: Gynecology;  Laterality: N/A;   CHOLECYSTECTOMY  2011   HERNIA REPAIR     umbilical hernia   SALPINGOOPHORECTOMY Right 09/06/2021   Procedure: OPEN RIGHT SALPINGO OOPHORECTOMY;  Surgeon: Jayne Vonn DEL, MD;  Location: [facility];  Service: Gynecology;  Laterality: Right;   SUPRACERVICAL ABDOMINAL HYSTERECTOMY N/A 09/06/2021   Procedure: HYSTERECTOMY SUPRACERVICAL ABDOMINAL;  Surgeon: Jayne Vonn DEL, MD;  Location: [facility];  Service: Gynecology;  Laterality: N/A;   TUBAL LIGATION  2009   UNILATERAL SALPINGECTOMY Left 09/06/2021   Procedure: LEFT SALPINGECTOMY;  Surgeon: Jayne Vonn DEL, MD;  Location: [facility];  Service: Gynecology;  Laterality: Left;    Social History   Socioeconomic History   Marital status: Married    Spouse name: Ashley [name]   Number of children: 3   Years of education: Not on file   Highest education level: Some college, no degree  Occupational History   Not on file  Tobacco Use   Smoking status: Never   Smokeless tobacco: Never  Vaping Use   Vaping status: Never Used  Substance and Sexual Activity   Alcohol use: Not Currently   Drug use: Never   Sexual activity: Not Currently    Birth control/protection: Surgical    Comment: tubal/hyst  Other Topics Concern   Not on file  Social History Narrative   Married since  June 2004.Lives with husband and 3 girls.Works as Stage manager.   Right handed   Caffiene- none   Social Drivers of Health   Financial Resource Strain: Medium Risk (07/01/2024)   Overall Financial Resource Strain (CARDIA)    Difficulty of Paying Living Expenses: Somewhat hard  Food Insecurity: Food Insecurity Present (07/01/2024)   Hunger Vital Sign    Worried About Running Out of Food in the Last Year: Sometimes true    Ran Out of Food in the Last Year: Sometimes true  Transportation Needs: No Transportation  Needs (07/01/2024)   PRAPARE - Administrator, Civil Service (Medical): No    Lack of Transportation (Non-Medical): No  Physical Activity: Insufficiently Active (07/01/2024)   Exercise Vital Sign    Days of Exercise per Week: 3 days    Minutes of Exercise per Session: 30 min  Stress: No Stress Concern Present (07/01/2024)   [employer] of Occupational Health - Occupational Stress Questionnaire    Feeling of Stress: Only a little  Social Connections: Socially Integrated (07/01/2024)   Social Connection and Isolation Panel    Frequency of Communication with Friends and Family: Three times a week    Frequency of Social Gatherings with Friends and Family: Twice a week    Attends Religious Services: More than 4 times per year    Active Member of [employer] or Organizations: Yes    Attends Banker Meetings: More than 4 times per year    Marital Status: Married  Catering manager Violence: Unknown (02/16/2022)   Received from [facility]   HITS    Physically Hurt: Not on file    Insult or Talk Down To: Not on file    Threaten Physical Harm: Not on file    Scream or Curse: Not on file    Outpatient Encounter Medications as of 08/26/2024  Medication Sig   losartan -hydrochlorothiazide (HYZAAR) 100-25 MG tablet Take 1 tablet by mouth daily.   meclizine  (ANTIVERT ) 12.5 MG tablet Take 1 tablet (12.5 mg total) by mouth 3 (three) times daily as needed for dizziness.   potassium chloride  SA (KLOR-CON  M) 20 MEQ tablet Take 2 tablets (40 mEq total) by mouth daily.   predniSONE  (DELTASONE ) 20 MG tablet Take 2 tablets (40 mg total) by mouth daily with breakfast for 5 days.   [DISCONTINUED] amLODipine  (NORVASC ) 5 MG tablet Take 1 tablet (5 mg total) by mouth daily.   amLODipine  (NORVASC ) 5 MG tablet Take 1 tablet (5 mg total) by mouth daily.   [DISCONTINUED] cefpodoxime (VANTIN) 200 MG tablet SMARTSIG:0.5 Tablet(s) By Mouth Every 12 Hours   [DISCONTINUED] ketoconazole  (NIZORAL ) 2  % cream Apply 1 Application topically daily.   No facility-administered encounter medications on file as of 08/26/2024.    No Known Allergies  Pertinent ROS per HPI, otherwise unremarkable      Objective:  BP 132/80   Pulse (!) 58   Temp 98 F (36.7 C)   Ht 5' 7 (1.702 m)   Wt 240 lb 9.6 oz (109.1 kg)   LMP 08/14/2021   SpO2 97%   BMI 37.68 kg/m    Wt Readings from Last 3 Encounters:  08/26/24 240 lb 9.6 oz (109.1 kg)  01/27/24 236 lb (107 kg)  01/17/24 231 lb 6.4 oz (105 kg)    Physical Exam Vitals and nursing note reviewed.  Constitutional:      General: She is not in acute distress.    Appearance: Normal appearance. She is morbidly obese.  She is not ill-appearing, toxic-appearing or diaphoretic.  HENT:     Head: Normocephalic and atraumatic.     Mouth/Throat:     Mouth: Mucous membranes are moist.  Eyes:     Conjunctiva/sclera: Conjunctivae normal.     Pupils: Pupils are equal, round, and reactive to light.  Cardiovascular:     Rate and Rhythm: Normal rate and regular rhythm.     Heart sounds: Normal heart sounds.  Pulmonary:     Effort: Pulmonary effort is normal.     Breath sounds: Normal breath sounds.  Musculoskeletal:     Left wrist: Normal.     Left hand: Swelling present. Decreased range of motion. Decreased strength of finger abduction, thumb/finger opposition and wrist extension.     Cervical back: Neck supple.     Right lower leg: No edema.     Left lower leg: No edema.     Comments: Compression glove to left hand  Skin:    General: Skin is warm and dry.     Capillary Refill: Capillary refill takes less than 2 seconds.  Neurological:     General: No focal deficit present.     Mental Status: She is alert and oriented to person, place, and time.  Psychiatric:        Mood and Affect: Mood normal.        Behavior: Behavior normal. Behavior is cooperative.        Thought Content: Thought content normal.        Judgment: Judgment normal.       Results for orders placed or performed in visit on 01/17/24  Anemia Profile B   Collection Time: 01/17/24  9:29 AM  Result Value Ref Range   Total Iron Binding Capacity 259 250 - 450 ug/dL   UIBC 799 868 - 574 ug/dL   Iron 59 27 - 840 ug/dL   Iron Saturation 23 15 - 55 %   Ferritin 63 15 - 150 ng/mL   Vitamin B-12 460 232 - 1,245 pg/mL   Folate 10.3 >3.0 ng/mL   WBC 5.4 3.4 - 10.8 x10E3/uL   RBC 4.20 3.77 - 5.28 x10E6/uL   Hemoglobin 11.7 11.1 - 15.9 g/dL   Hematocrit 63.4 65.9 - 46.6 %   MCV 87 79 - 97 fL   MCH 27.9 26.6 - 33.0 pg   MCHC 32.1 31.5 - 35.7 g/dL   RDW 86.8 88.2 - 84.5 %   Platelets 293 150 - 450 x10E3/uL   Neutrophils 61 Not Estab. %   Lymphs 31 Not Estab. %   Monocytes 6 Not Estab. %   Eos 1 Not Estab. %   Basos 1 Not Estab. %   Neutrophils Absolute 3.3 1.4 - 7.0 x10E3/uL   Lymphocytes Absolute 1.7 0.7 - 3.1 x10E3/uL   Monocytes Absolute 0.3 0.1 - 0.9 x10E3/uL   EOS (ABSOLUTE) 0.1 0.0 - 0.4 x10E3/uL   Basophils Absolute 0.0 0.0 - 0.2 x10E3/uL   Immature Granulocytes 0 Not Estab. %   Immature Grans (Abs) 0.0 0.0 - 0.1 x10E3/uL   Retic Ct Pct 1.9 0.6 - 2.6 %  CMP14+EGFR   Collection Time: 01/17/24  9:29 AM  Result Value Ref Range   Glucose 101 (H) 70 - 99 mg/dL   BUN 12 6 - 24 mg/dL   Creatinine, Ser 8.88 (H) 0.57 - 1.00 mg/dL   eGFR 62 >40 fO/fpw/8.26   BUN/Creatinine Ratio 11 9 - 23   Sodium 142 134 - 144 mmol/L  Potassium 3.8 3.5 - 5.2 mmol/L   Chloride 100 96 - 106 mmol/L   CO2 26 20 - 29 mmol/L   Calcium 9.5 8.7 - 10.2 mg/dL   Total Protein 7.6 6.0 - 8.5 g/dL   Albumin 4.3 3.9 - 4.9 g/dL   Globulin, Total 3.3 1.5 - 4.5 g/dL   Bilirubin Total 0.7 0.0 - 1.2 mg/dL   Alkaline Phosphatase 80 44 - 121 IU/L   AST 16 0 - 40 IU/L   ALT 9 0 - 32 IU/L  Lipid panel   Collection Time: 01/17/24  9:29 AM  Result Value Ref Range   Cholesterol, Total 156 100 - 199 mg/dL   Triglycerides 65 0 - 149 mg/dL   HDL 40 >60 mg/dL   VLDL Cholesterol Cal  13 5 - 40 mg/dL   LDL Chol Calc (NIH) 896 (H) 0 - 99 mg/dL   Chol/HDL Ratio 3.9 0.0 - 4.4 ratio  Thyroid  Panel With TSH   Collection Time: 01/17/24  9:29 AM  Result Value Ref Range   TSH 2.950 0.450 - 4.500 uIU/mL   T4, Total 7.7 4.5 - 12.0 ug/dL   T3 Uptake Ratio 29 24 - 39 %   Free Thyroxine Index 2.2 1.2 - 4.9  VITAMIN D  25 Hydroxy (Vit-D Deficiency, Fractures)   Collection Time: 01/17/24  9:29 AM  Result Value Ref Range   Vit D, 25-Hydroxy 31.0 30.0 - 100.0 ng/mL  Cologuard   Collection Time: 01/27/24  1:57 PM  Result Value Ref Range   COLOGUARD Negative Negative       Pertinent labs & imaging results that were available during my care of the patient were reviewed by me and considered in my medical decision making.  Assessment & Plan:  Ashley [name] was seen today for hand pain and hypertension.  Diagnoses and all orders for this visit:  Primary hypertension -     amLODipine  (NORVASC ) 5 MG tablet; Take 1 tablet (5 mg total) by mouth daily.  Chronic pain of left hand -     Ambulatory referral to Pain Clinic -     predniSONE  (DELTASONE ) 20 MG tablet; Take 2 tablets (40 mg total) by mouth daily with breakfast for 5 days.  Stage 3a chronic kidney disease (HCC) Followed by nephrology      Chronic left hand pain and dysfunction post-2022 injury Persistent pain, shocking sensations, and functional impairment in the right hand since a 2022 injury. Previous management included physical therapy and acetaminophen . She is hesitant to use NSAIDs due to renal concerns. - Refer to Physical Medicine and Rehabilitation for further evaluation and management, including potential use of injectables and other modalities. - Prescribe prednisone  to manage inflammation and nerve pain without affecting renal function.  Essential hypertension Hypertension managed with amlodipine  and lisinopril. She has been out of amlodipine  for about a week, with fluctuating blood pressure readings but not  excessively high. Occasional headaches possibly related to hand pain. Lisinopril aids in managing swelling. - Refill amlodipine  prescription. - Continue lisinopril as prescribed.  Chronic kidney disease, unspecified stage Chronic kidney disease with recent labs showing improved renal function. Creatinine at 0.96 and GFR at 74. No recent changes in urine output or swelling. Regular nephrology follow-up every six months. - No additional labs needed at this time.          Continue all other maintenance medications.  Follow up plan: Return in about 6 months (around 02/24/2025), or if symptoms worsen or fail to improve, for Annual Physical.  Continue healthy lifestyle choices, including diet (rich in fruits, vegetables, and lean proteins, and low in salt and simple carbohydrates) and exercise (at least 30 minutes of moderate physical activity daily).  Educational handout given for DASH diet, HTN  The above assessment and management plan was discussed with the patient. The patient verbalized understanding of and has agreed to the management plan. Patient is aware to call the clinic if they develop any new symptoms or if symptoms persist or worsen. Patient is aware when to return to the clinic for a follow-up visit. Patient educated on when it is appropriate to go to the emergency department.   Ashley Bruns, FNP-C [facility] [phone]

## 2024-08-26 NOTE — Patient Instructions (Signed)
 Goal BP:  Less than 130/80  Take your medications faithfully as prescribed. Maintain a healthy weight. Get at least 150 minutes of aerobic exercise per week. Minimize salt intake, less than 2000 mg per day. Minimize alcohol intake.  DASH Eating Plan DASH stands for Dietary Approaches to Stop Hypertension. The DASH eating plan is a healthy eating plan that has been shown to reduce high blood pressure (hypertension). Additional health benefits may include reducing the risk of type 2 diabetes mellitus, heart disease, and stroke. The DASH eating plan may also help with weight loss.  WHAT DO I NEED TO KNOW ABOUT THE DASH EATING PLAN? For the DASH eating plan, you will follow these general guidelines: Choose foods with a percent daily value for sodium of less than 5% (as listed on the food label). Use salt-free seasonings or herbs instead of table salt or sea salt. Check with your health care provider or pharmacist before using salt substitutes. Eat lower-sodium products, often labeled as lower sodium or no salt added. Eat fresh foods. Eat more vegetables, fruits, and low-fat dairy products. Choose whole grains. Look for the word whole as the first word in the ingredient list. Choose fish and skinless chicken or [place] more often than red meat. Limit fish, poultry, and meat to 6 oz (170 g) each day. Limit sweets, desserts, sugars, and sugary drinks. Choose heart-healthy fats. Limit cheese to 1 oz (28 g) per day. Eat more home-cooked food and less restaurant, buffet, and fast food. Limit fried foods. Cook foods using methods other than frying. Limit canned vegetables. If you do use them, rinse them well to decrease the sodium. When eating at a restaurant, ask that your food be prepared with less salt, or no salt if possible.  WHAT FOODS CAN I EAT? Seek help from a dietitian for individual calorie needs.  Grains Whole grain or whole wheat bread. Hoshino rice. Whole grain or whole  wheat pasta. Quinoa, bulgur, and whole grain cereals. Low-sodium cereals. Corn or whole wheat flour tortillas. Whole grain cornbread. Whole grain crackers. Low-sodium crackers.  Vegetables Fresh or frozen vegetables (raw, steamed, roasted, or grilled). Low-sodium or reduced-sodium tomato and vegetable juices. Low-sodium or reduced-sodium tomato sauce and paste. Low-sodium or reduced-sodium canned vegetables.   Fruits All fresh, canned (in natural juice), or frozen fruits.  Meat and Other Protein Products Ground beef (85% or leaner), grass-fed beef, or beef trimmed of fat. Skinless chicken or [place]. Ground chicken or [place]. Pork trimmed of fat. All fish and seafood. Eggs. Dried beans, peas, or lentils. Unsalted nuts and seeds. Unsalted canned beans.  Dairy Low-fat dairy products, such as skim or 1% milk, 2% or reduced-fat cheeses, low-fat ricotta or cottage cheese, or plain low-fat yogurt. Low-sodium or reduced-sodium cheeses.  Fats and Oils Tub margarines without trans fats. Light or reduced-fat mayonnaise and salad dressings (reduced sodium). Avocado. Safflower, olive, or canola oils. Natural peanut or almond butter.  Other Unsalted popcorn and pretzels. The items listed above may not be a complete list of recommended foods or beverages. Contact your dietitian for more options.  WHAT FOODS ARE NOT RECOMMENDED?  Grains White bread. White pasta. White rice. Refined cornbread. Bagels and croissants. Crackers that contain trans fat.  Vegetables Creamed or fried vegetables. Vegetables in a cheese sauce. Regular canned vegetables. Regular canned tomato sauce and paste. Regular tomato and vegetable juices.  Fruits Dried fruits. Canned fruit in light or heavy syrup. Fruit juice.  Meat and Other Protein Products Fatty cuts of meat. Ribs,  chicken wings, bacon, sausage, bologna, salami, chitterlings, fatback, hot dogs, bratwurst, and packaged luncheon meats. Salted nuts and seeds. Canned  beans with salt.  Dairy Whole or 2% milk, cream, half-and-half, and cream cheese. Whole-fat or sweetened yogurt. Full-fat cheeses or blue cheese. Nondairy creamers and whipped toppings. Processed cheese, cheese spreads, or cheese curds.  Condiments Onion and garlic salt, seasoned salt, table salt, and sea salt. Canned and packaged gravies. Worcestershire sauce. Tartar sauce. Barbecue sauce. Teriyaki sauce. Soy sauce, including reduced sodium. Steak sauce. Fish sauce. Oyster sauce. Cocktail sauce. Horseradish. Ketchup and mustard. Meat flavorings and tenderizers. Bouillon cubes. Hot sauce. Tabasco sauce. Marinades. Taco seasonings. Relishes.  Fats and Oils Butter, stick margarine, lard, shortening, ghee, and bacon fat. Coconut, palm kernel, or palm oils. Regular salad dressings.  Other Pickles and olives. Salted popcorn and pretzels.  The items listed above may not be a complete list of foods and beverages to avoid. Contact your dietitian for more information.  WHERE CAN I FIND MORE INFORMATION? National Heart, Lung, and Blood Institute: [employer] Document Released: 10/18/2011 Document Revised: 03/15/2014 Document Reviewed: 09/02/2013 [facility] Patient Information 2015 Ashley [name], Ashley [name]. This information is not intended to replace advice given to you by your health care provider. Make sure you discuss any questions you have with your health care provider.   I think that you would greatly benefit from seeing a nutritionist.  If you are interested, please call Dr. Wonda at [phone] to schedule an appointment.

## 2024-09-02 ENCOUNTER — Encounter: Payer: Self-pay | Admitting: Family Medicine

## 2024-09-21 ENCOUNTER — Telehealth: Admitting: Physician Assistant

## 2024-09-21 DIAGNOSIS — R6889 Other general symptoms and signs: Secondary | ICD-10-CM | POA: Diagnosis not present

## 2024-09-21 DIAGNOSIS — R519 Headache, unspecified: Secondary | ICD-10-CM | POA: Diagnosis not present

## 2024-09-21 DIAGNOSIS — R112 Nausea with vomiting, unspecified: Secondary | ICD-10-CM

## 2024-09-21 DIAGNOSIS — R52 Pain, unspecified: Secondary | ICD-10-CM

## 2024-09-21 MED ORDER — ONDANSETRON 4 MG PO TBDP: 4.0000 mg | ORAL_TABLET | Freq: Three times a day (TID) | ORAL | 0 refills | Status: AC | PRN

## 2024-09-21 MED ORDER — FLUTICASONE PROPIONATE 50 MCG/ACT NA SUSP: 2.0000 | Freq: Every day | NASAL | 0 refills | Status: AC

## 2024-09-21 MED ORDER — NAPROXEN 500 MG PO TABS: 500.0000 mg | ORAL_TABLET | Freq: Two times a day (BID) | ORAL | 0 refills | Status: AC

## 2024-09-21 NOTE — Progress Notes (Signed)
 E visit for Flu like symptoms   We are sorry that you are not feeling well.  Here is how we plan to help! Based on what you have shared with me it looks like you may have flu-like symptoms that should be watched but do not seem to indicate anti-viral treatment.  Influenza or "the flu" is  an infection caused by a respiratory virus. The flu virus is highly contagious and persons who did not receive their yearly flu vaccination may "catch" the flu from close contact.  We have anti-viral medications to treat the viruses that cause this infection. They are not a "cure" and only shorten the course of the infection. These prescriptions are most effective when they are given within the first 2 days of "flu" symptoms. Antiviral medications are indicated if you have a high risk of complications from the flu. You should  also consider an antiviral medication if you are in close contact with someone who is at risk. These medications can help patients avoid complications from the flu but have side effects that you should know.   Possible side effects from Tamiflu or oseltamivir include nausea, vomiting, diarrhea, dizziness, headaches, eye redness, sleep problems or other respiratory symptoms. You should not take Tamiflu if you have an allergy to oseltamivir or any to the ingredients in Tamiflu.  For nasal congestion, you may use an oral decongestant such as Mucinex D or if you have glaucoma or high blood pressure use plain Mucinex.  Saline nasal spray or nasal drops can help and can safely be used as often as needed for congestion.  If you have a sore or scratchy throat, use a saltwater gargle-  to  teaspoon of salt dissolved in a 4-ounce to 8-ounce glass of warm water.  Gargle the solution for approximately 15-30 seconds and then spit.  It is important not to swallow the solution.  You can also use throat lozenges/cough drops and Chloraseptic spray to help with throat pain or discomfort.  Warm or cold liquids  can also be helpful in relieving throat pain.  For headache, pain or general discomfort, you can use Ibuprofen or Tylenol  as directed.   Some authorities believe that zinc sprays or the use of Echinacea may shorten the course of your symptoms.  I have prescribed the following medications to help lessen symptoms: I have prescribed an anti-inflammatory - Naprosyn 500 mg. Take twice daily as needed for fever or body aches for 2 weeks, I have prescribed Fluticasone nasal spray 2 sprays in each nostril one time per dayasal spray 2 sprays in each nostril one time per day, and I have prescribed Zofran  4 mg tablets 1 every 6 hours if needed for nausea  You are to isolate at home until you have been fever-free for at least 24 hours without a fever-reducing medication, and symptoms have been steadily improving for 24 hours.  If you must be around other household members who do not have symptoms, you need to make sure that both you and the family members are masking consistently with a high-quality mask.  If you note any worsening of symptoms despite treatment, please seek an in-person evaluation ASAP. If you note any significant shortness of breath or any chest pain, please seek ED evaluation. Please do not delay care!  ANYONE WHO HAS FLU SYMPTOMS SHOULD: Stay home. The flu is highly contagious and going out or to work exposes others! Be sure to drink plenty of fluids. Water is fine as well as fruit  juices, sodas and electrolyte beverages. You may want to stay away from caffeine or alcohol. If you are nauseated, try taking small sips of liquids. How do you know if you are getting enough fluid? Your urine should be a pale yellow or almost colorless. Get rest. Taking a steamy shower or using a humidifier may help nasal congestion and ease sore throat pain. Using a saline nasal spray works much the same way. Cough drops, hard candies and sore throat lozenges may ease your cough. Line up a caregiver. Have  someone check on you regularly.  GET HELP RIGHT AWAY IF: You cannot keep down liquids or your medications. You become short of breath Your fell like you are going to pass out or loose consciousness. Your symptoms persist after you have completed your treatment plan  MAKE SURE YOU  Understand these instructions. Will watch your condition. Will get help right away if you are not doing well or get worse.  Your e-visit answers were reviewed by a board certified advanced clinical practitioner to complete your personal care plan.  Depending on the condition, your plan could have included both over the counter or prescription medications.  If there is a problem please reply  once you have received a response from your provider.  Your safety is important to us .  If you have drug allergies check your prescription carefully.    You can use MyChart to ask questions about today's visit, request a non-urgent call back, or ask for a work or school excuse for 24 hours related to this e-Visit. If it has been greater than 24 hours you will need to follow up with your provider, or enter a new e-Visit to address those concerns.  You will get an e-mail in the next two days asking about your experience.  I hope that your e-visit has been valuable and will speed your recovery. Thank you for using e-visits.   I have spent 5 minutes in review of e-visit questionnaire, review and updating patient chart, medical decision making and response to patient.   Delon CHRISTELLA Dickinson, PA-C

## 2024-10-06 ENCOUNTER — Encounter: Attending: Physical Medicine & Rehabilitation | Admitting: Physical Medicine & Rehabilitation

## 2024-10-06 ENCOUNTER — Encounter: Payer: Self-pay | Admitting: Physical Medicine & Rehabilitation

## 2024-10-06 VITALS — BP 153/95 | HR 68 | Ht 67.0 in | Wt 238.8 lb

## 2024-10-06 DIAGNOSIS — G90511 Complex regional pain syndrome I of right upper limb: Secondary | ICD-10-CM | POA: Diagnosis present

## 2024-10-06 MED ORDER — GABAPENTIN 100 MG PO CAPS: 100.0000 mg | ORAL_CAPSULE | Freq: Three times a day (TID) | ORAL | 2 refills | Status: AC

## 2024-10-06 NOTE — Patient Instructions (Addendum)
                     VISIT SUMMARY: You visited us  today for pain management of your left hand due to complex regional pain syndrome (CRPS). We discussed your symptoms, previous treatments, and current challenges with daily activities.  YOUR PLAN: COMPLEX REGIONAL PAIN SYNDROME (CRPS) OF LEFT UPPER LIMB: You have chronic CRPS in your left hand, especially affecting your fourth finger, causing numbness, tingling, sharp pain, and limited movement. -Start taking gabapentin  100 mg three times a day. -If gabapentin  does not help enough, we may consider a stellate ganglion block. -Follow up with us  in three months. -Call us  if you do not see any improvement in 1-2 weeks so we can adjust your medication.                      Contains text generated by Abridge.                                 Contains text generated by Abridge.       Contains text generated by Abridge.                                 Contains text generated by Abridge.

## 2024-10-06 NOTE — Progress Notes (Signed)
 Subjective:    Patient ID: Ashley [name], female    DOB: 1976-10-05, 48 y.o.   MRN: [id number]  HPI Discussed the use of AI scribe software for clinical note transcription with the patient, who gave verbal consent to proceed.  History of Present Illness Ashley [name] is a 48 year old female with complex regional pain syndrome who presents for pain management of her left hand. She was referred by her primary care physician for pain management.  She has a history of complex regional pain syndrome (CRPS) in her left hand following an injury on March 09, 2021, when she hit her left hand on a TV at work. She experienced immediate swelling, numbness, and tingling in her fingers. Initial urgent care evaluation showed no infection, and x-rays revealed no fracture. An MRI indicated extensor tendon swelling around the fourth digit.  She was seen by a hand surgeon and underwent physical therapy and occupational therapy, which were not significantly helpful. She was previously treated with gabapentin  and a prednisone  taper, which provided some initial relief. An EMG conducted in September 2023 showed no nerve pinching or neuropathy.  She describes her symptoms as constant pain, tingling, and numbness in her left hand, with difficulty gripping and picking up objects. The pain is described as 'always just like that little worsen numbing feeling' and sometimes sharp. She wears a glove on her left hand and tries to use it as a helper hand but struggles with normal activities.  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 She manages household tasks with difficulty, often requiring assistance from her family. She has been on gabapentin  in the past, which she believes was helpful initially, and currently uses Tylenol  for pain relief.  She also has a history of a small shoulder lipoma, which was evaluated in May 2023. She has seen  multiple doctors and has a pending disability claim due to her inability to work.   Pain Inventory Average Pain 10 Pain Right Now 8 My pain is sharp and aching, constant  In the last 24 hours, has pain interfered with the following? General activity 8 Relation with others 7 Enjoyment of life 10 What TIME of day is your pain at its worst? Morning, daytime, evening and night (all day long) Sleep (in general) Poor  Pain is worse with: some activites Pain improves with: heat/ice Relief from Meds: 4  Family History  Problem Relation Age of Onset   Hypertension Mother    Cancer Mother        skin related / graft   CVA Father    Crohn's disease Father    Heart murmur Sister    Hypertension Maternal Grandmother    Diabetes Maternal Grandmother    Dementia Maternal Grandfather    Pneumonia Paternal Grandfather    Social History   Socioeconomic History   Marital status: Married    Spouse name: Camellia   Number of children: 3   Years of education: Not on file   Highest education level: Some college, no degree  Occupational History   Not on file  Tobacco Use   Smoking status: Never   Smokeless tobacco: Never  Vaping Use   Vaping status: Never Used  Substance and Sexual Activity   Alcohol use: Not Currently   Drug use: Never   Sexual activity: Not Currently    Birth control/protection: Surgical    Comment: tubal/hyst  Other Topics Concern   Not on file  Social History Narrative   Married since June 2004.Lives with husband and 3 girls.Works as stage manager.   Right handed   Caffiene- none   Social Drivers of Health   Financial Resource Strain: Medium Risk (07/01/2024)   Overall Financial Resource Strain (CARDIA)    Difficulty of Paying Living Expenses: Somewhat hard  Food Insecurity: Food Insecurity Present (07/01/2024)   Hunger Vital Sign    Worried About Running Out of Food in the Last Year: Sometimes true    Ran Out of Food in the Last Year: Sometimes true   Transportation Needs: No Transportation Needs (07/01/2024)   PRAPARE - Administrator, Civil Service (Medical): No    Lack of Transportation (Non-Medical): No  Physical Activity: Insufficiently Active (07/01/2024)   Exercise Vital Sign    Days of Exercise per Week: 3 days    Minutes of Exercise per Session: 30 min  Stress: No Stress Concern Present (07/01/2024)   [employer] of Occupational Health - Occupational Stress Questionnaire    Feeling of Stress: Only a little  Social Connections: Socially Integrated (07/01/2024)   Social Connection and Isolation Panel    Frequency of Communication with Friends and Family: Three times a week    Frequency of Social Gatherings with Friends and Family: Twice a week    Attends Religious Services: More than 4 times per year    Active Member of Clubs or Organizations: Yes    Attends Banker Meetings: More than 4 times per year    Marital Status: Married   Past Surgical History:  Procedure Laterality Date   CERVICAL CONIZATION W/BX N/A 09/06/2021   Procedure: ENDOCERVICAL CONIZATION;  Surgeon: Jayne Vonn DEL, MD;  Location: [facility];  Service: Gynecology;  Laterality: N/A;   CHOLECYSTECTOMY  2011   HERNIA REPAIR     umbilical hernia   SALPINGOOPHORECTOMY Right 09/06/2021   Procedure: OPEN RIGHT SALPINGO OOPHORECTOMY;  Surgeon: Jayne Vonn DEL, MD;  Location: [facility];  Service: Gynecology;  Laterality: Right;   SUPRACERVICAL ABDOMINAL HYSTERECTOMY N/A 09/06/2021   Procedure: HYSTERECTOMY SUPRACERVICAL ABDOMINAL;  Surgeon: Jayne Vonn DEL, MD;  Location: [facility];  Service: Gynecology;  Laterality: N/A;   TUBAL LIGATION  2009   UNILATERAL SALPINGECTOMY Left 09/06/2021   Procedure: LEFT SALPINGECTOMY;  Surgeon: Jayne Vonn DEL, MD;  Location: [facility];  Service: Gynecology;  Laterality: Left;   Past Surgical History:  Procedure Laterality Date   CERVICAL CONIZATION W/BX N/A 09/06/2021   Procedure: ENDOCERVICAL CONIZATION;   Surgeon: Jayne Vonn DEL, MD;  Location: [facility];  Service: Gynecology;  Laterality: N/A;   CHOLECYSTECTOMY  2011   HERNIA REPAIR     umbilical hernia   SALPINGOOPHORECTOMY Right 09/06/2021   Procedure: OPEN RIGHT SALPINGO OOPHORECTOMY;  Surgeon: Jayne Vonn DEL, MD;  Location: [facility];  Service: Gynecology;  Laterality: Right;   SUPRACERVICAL ABDOMINAL HYSTERECTOMY N/A 09/06/2021   Procedure: HYSTERECTOMY SUPRACERVICAL ABDOMINAL;  Surgeon: Jayne Vonn DEL, MD;  Location: [facility];  Service: Gynecology;  Laterality: N/A;   TUBAL LIGATION  2009   UNILATERAL SALPINGECTOMY Left 09/06/2021   Procedure: LEFT SALPINGECTOMY;  Surgeon: Jayne Vonn DEL, MD;  Location: [facility];  Service: Gynecology;  Laterality: Left;   Past Medical History:  Diagnosis Date   Hernia of abdominal cavity    Hypertension    BP (!) 153/95 (BP Location: Left Arm, Patient Position: Sitting, Cuff Size: Large) Comment: second BP reading  Pulse  68   Ht 5' 7 (1.702 m)   Wt 238 lb 12.8 oz (108.3 kg)   LMP 08/14/2021   SpO2 97%   BMI 37.40 kg/m   Opioid Risk Score:   Fall Risk Score:  `1  Depression screen [facility] 2/9     08/26/2024    8:17 AM 01/17/2024    9:05 AM 09/26/2023    3:45 PM 07/12/2023    8:34 AM 05/28/2023    8:39 AM 07/31/2022    2:44 PM 06/12/2022    3:13 PM  Depression screen PHQ 2/9  Decreased Interest 2 0 0 0 0 0 0  Down, Depressed, Hopeless 1 0 0 0 0 0 0  PHQ - 2 Score 3 0 0 0 0 0 0  Altered sleeping 0 0 0 0 0  0  Tired, decreased energy 0 0 0 0 0  0  Change in appetite 0 0 0 0 0  0  Feeling bad or failure about yourself  0 0 0 0 0  0  Trouble concentrating 0 0 0 0 0  0  Moving slowly or fidgety/restless 0 0 0 0 0  0  Suicidal thoughts 0 0 0 0 0  0  PHQ-9 Score 3  0  0  0  0   0   Difficult doing work/chores Somewhat difficult Not difficult at all  Not difficult at all   Not difficult at all     Data saved with a previous flowsheet row definition      Review of Systems  Musculoskeletal:  Positive  for myalgias.       Left arm and hand pain  All other systems reviewed and are negative.      Objective:   Physical Exam  General no acute distress Mood and affect appropriate Right upper extremity normal strength normal sensation normal range of motion Left upper extremity normal range of motion at the shoulder and elbow and wrist has reduced finger flexion at the DIP PIP and MCP at digits 3 4 and 5.  She has normal flexion at the index finger and thumb as well as normal extension at those joints as well. There is no evidence of synovitis No dystrophic nail changes No evidence of hyperhidrosis or dyshidrosis. Skin is shiny over the dorsum of the 3rd, 4th and 5th digits of the left hand. There is lipoma at the left subclavicular area      Assessment & Plan:   Assessment and Plan Assessment & Plan Complex regional pain syndrome I of left upper limb with limited range of motion of left fourth finger Chronic CRPS I affecting left upper limb, primarily left fourth finger, with numbness, tingling, sharp pain, and limited ROM. Previous treatments had limited success. Current focus on pain control and function improvement. Surgery not recommended. - Initiated gabapentin  100 mg TID. - Consider stellate ganglion block if gabapentin  insufficient. - Follow-up in three months. - Advised to call if no improvement in 1-2 weeks for medication adjustment.

## 2024-10-30 ENCOUNTER — Encounter: Payer: Self-pay | Admitting: Physical Medicine & Rehabilitation

## 2025-01-05 ENCOUNTER — Encounter: Admitting: Physical Medicine & Rehabilitation

## 2025-01-20 ENCOUNTER — Encounter: Payer: Self-pay | Admitting: Family Medicine
